# Patient Record
Sex: Male | Born: 1975
Health system: Southern US, Community
[De-identification: ages and names within clinical notes are randomized; demographics above are authoritative.]

## PROBLEM LIST (undated history)

## (undated) DIAGNOSIS — F319 Bipolar disorder, unspecified: Secondary | ICD-10-CM

## (undated) DIAGNOSIS — I219 Acute myocardial infarction, unspecified: Secondary | ICD-10-CM

## (undated) DIAGNOSIS — F1910 Other psychoactive substance abuse, uncomplicated: Secondary | ICD-10-CM

## (undated) DIAGNOSIS — R45851 Suicidal ideations: Secondary | ICD-10-CM

## (undated) DIAGNOSIS — F99 Mental disorder, not otherwise specified: Secondary | ICD-10-CM

## (undated) DIAGNOSIS — K859 Acute pancreatitis without necrosis or infection, unspecified: Secondary | ICD-10-CM

## (undated) DIAGNOSIS — N200 Calculus of kidney: Secondary | ICD-10-CM

## (undated) DIAGNOSIS — F32A Depression, unspecified: Secondary | ICD-10-CM

## (undated) DIAGNOSIS — F419 Anxiety disorder, unspecified: Secondary | ICD-10-CM

## (undated) DIAGNOSIS — F329 Major depressive disorder, single episode, unspecified: Secondary | ICD-10-CM

## (undated) HISTORY — DX: Acute myocardial infarction, unspecified: I21.9

## (undated) HISTORY — PX: ESOPHAGEAL DILATION: SHX303

## (undated) HISTORY — DX: Acute pancreatitis without necrosis or infection, unspecified: K85.90

## (undated) HISTORY — PX: COLONOSCOPY: SHX174

---
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
## 2004-05-12 DIAGNOSIS — I219 Acute myocardial infarction, unspecified: Secondary | ICD-10-CM

## 2004-05-12 HISTORY — DX: Acute myocardial infarction, unspecified: I21.9

## 2005-11-05 HISTORY — PX: CARDIAC CATHETERIZATION: SHX172

## 2016-07-20 ENCOUNTER — Encounter (HOSPITAL_COMMUNITY): Payer: Self-pay | Admitting: Emergency Medicine

## 2016-07-20 ENCOUNTER — Emergency Department (HOSPITAL_COMMUNITY): Payer: Self-pay

## 2016-07-20 ENCOUNTER — Emergency Department (HOSPITAL_COMMUNITY)
Admission: EM | Admit: 2016-07-20 | Discharge: 2016-07-22 | Disposition: A | Discharge status: Other Acute Inpatient Hospital | Payer: Self-pay | Attending: Emergency Medicine | Admitting: Emergency Medicine

## 2016-07-20 DIAGNOSIS — R45851 Suicidal ideations: Secondary | ICD-10-CM

## 2016-07-20 DIAGNOSIS — Z79899 Other long term (current) drug therapy: Secondary | ICD-10-CM | POA: Insufficient documentation

## 2016-07-20 DIAGNOSIS — Z7289 Other problems related to lifestyle: Secondary | ICD-10-CM | POA: Insufficient documentation

## 2016-07-20 DIAGNOSIS — R109 Unspecified abdominal pain: Secondary | ICD-10-CM

## 2016-07-20 DIAGNOSIS — Z765 Malingerer [conscious simulation]: Secondary | ICD-10-CM

## 2016-07-20 DIAGNOSIS — F17210 Nicotine dependence, cigarettes, uncomplicated: Secondary | ICD-10-CM | POA: Insufficient documentation

## 2016-07-20 DIAGNOSIS — R319 Hematuria, unspecified: Secondary | ICD-10-CM

## 2016-07-20 DIAGNOSIS — R10A2 Flank pain, left side: Secondary | ICD-10-CM

## 2016-07-20 DIAGNOSIS — F1910 Other psychoactive substance abuse, uncomplicated: Secondary | ICD-10-CM

## 2016-07-20 HISTORY — DX: Other psychoactive substance abuse, uncomplicated: F19.10

## 2016-07-20 HISTORY — DX: Calculus of kidney: N20.0

## 2016-07-20 HISTORY — DX: Mental disorder, not otherwise specified: F99

## 2016-07-20 LAB — CBC WITH DIFFERENTIAL/PLATELET
Basophils Absolute: 0.1 10*3/uL (ref 0.0–0.1)
Basophils Relative: 1 %
EOS ABS: 0.2 10*3/uL (ref 0.0–0.7)
EOS PCT: 3 %
HCT: 41.5 % (ref 39.0–52.0)
HEMOGLOBIN: 13.9 g/dL (ref 13.0–17.0)
LYMPHS ABS: 2.7 10*3/uL (ref 0.7–4.0)
Lymphocytes Relative: 33 %
MCH: 30 pg (ref 26.0–34.0)
MCHC: 33.5 g/dL (ref 30.0–36.0)
MCV: 89.6 fL (ref 78.0–100.0)
MONOS PCT: 6 %
Monocytes Absolute: 0.5 10*3/uL (ref 0.1–1.0)
Neutro Abs: 4.7 10*3/uL (ref 1.7–7.7)
Neutrophils Relative %: 57 %
Platelets: 219 10*3/uL (ref 150–400)
RBC: 4.63 MIL/uL (ref 4.22–5.81)
RDW: 12.2 % (ref 11.5–15.5)
WBC: 8.1 10*3/uL (ref 4.0–10.5)

## 2016-07-20 LAB — BASIC METABOLIC PANEL
Anion gap: 7 (ref 5–15)
BUN: 11 mg/dL (ref 6–20)
CHLORIDE: 105 mmol/L (ref 101–111)
CO2: 26 mmol/L (ref 22–32)
Calcium: 9.4 mg/dL (ref 8.9–10.3)
Creatinine, Ser: 0.92 mg/dL (ref 0.61–1.24)
Glucose, Bld: 91 mg/dL (ref 65–99)
POTASSIUM: 3.7 mmol/L (ref 3.5–5.1)
Sodium: 138 mmol/L (ref 135–145)

## 2016-07-20 LAB — URINALYSIS, ROUTINE W REFLEX MICROSCOPIC
Bilirubin Urine: NEGATIVE
Glucose, UA: NEGATIVE mg/dL
Ketones, ur: NEGATIVE mg/dL
Leukocytes, UA: NEGATIVE
Nitrite: NEGATIVE
Protein, ur: NEGATIVE mg/dL
SPECIFIC GRAVITY, URINE: 1.017 (ref 1.005–1.030)
pH: 5 (ref 5.0–8.0)

## 2016-07-20 LAB — RAPID URINE DRUG SCREEN, HOSP PERFORMED
AMPHETAMINES: NOT DETECTED
BENZODIAZEPINES: NOT DETECTED
Barbiturates: NOT DETECTED
Cocaine: NOT DETECTED
Opiates: NOT DETECTED
Tetrahydrocannabinol: POSITIVE — AB

## 2016-07-20 LAB — ETHANOL

## 2016-07-20 MED ORDER — CLONIDINE HCL 0.1 MG PO TABS: 0.1000 mg | ORAL_TABLET | Freq: Every day | ORAL | Status: DC

## 2016-07-20 MED ORDER — LOPERAMIDE HCL 2 MG PO CAPS: 2.0000 mg | ORAL_CAPSULE | ORAL | Status: DC | PRN

## 2016-07-20 MED ORDER — DICYCLOMINE HCL 20 MG PO TABS
20.0000 mg | ORAL_TABLET | Freq: Four times a day (QID) | ORAL | Status: DC | PRN
  Filled 2016-07-20: qty 1

## 2016-07-20 MED ORDER — ONDANSETRON 4 MG PO TBDP: 4.0000 mg | ORAL_TABLET | Freq: Four times a day (QID) | ORAL | Status: DC | PRN

## 2016-07-20 MED ORDER — ALUM & MAG HYDROXIDE-SIMETH 200-200-20 MG/5ML PO SUSP: 30.0000 mL | ORAL | Status: DC | PRN

## 2016-07-20 MED ORDER — NAPROXEN 250 MG PO TABS: 500.0000 mg | ORAL_TABLET | Freq: Two times a day (BID) | ORAL | Status: DC | PRN

## 2016-07-20 MED ORDER — HYDROXYZINE HCL 25 MG PO TABS
25.0000 mg | ORAL_TABLET | Freq: Four times a day (QID) | ORAL | Status: DC | PRN
Start: 2016-07-20 — End: 2016-07-22
  Administered 2016-07-22: 25 mg via ORAL
  Filled 2016-07-20: qty 1

## 2016-07-20 MED ORDER — CLONIDINE HCL 0.1 MG PO TABS
0.1000 mg | ORAL_TABLET | Freq: Four times a day (QID) | ORAL | Status: DC
  Administered 2016-07-20 – 2016-07-21 (×3): 0.1 mg via ORAL
  Filled 2016-07-20 (×3): qty 1

## 2016-07-20 MED ORDER — OXYCODONE-ACETAMINOPHEN 5-325 MG PO TABS
1.0000 | ORAL_TABLET | Freq: Once | ORAL | Status: AC
  Administered 2016-07-20: 1 via ORAL
  Filled 2016-07-20: qty 1

## 2016-07-20 MED ORDER — METHOCARBAMOL 500 MG PO TABS: 500.0000 mg | ORAL_TABLET | Freq: Three times a day (TID) | ORAL | Status: DC | PRN

## 2016-07-20 MED ORDER — NICOTINE 21 MG/24HR TD PT24: 21.0000 mg | MEDICATED_PATCH | Freq: Every day | TRANSDERMAL | Status: DC | PRN

## 2016-07-20 MED ORDER — CLONIDINE HCL 0.1 MG PO TABS: 0.1000 mg | ORAL_TABLET | ORAL | Status: DC

## 2016-07-20 MED ORDER — ONDANSETRON 8 MG PO TBDP
8.0000 mg | ORAL_TABLET | Freq: Once | ORAL | Status: AC
  Administered 2016-07-20: 8 mg via ORAL
  Filled 2016-07-20: qty 1

## 2016-07-20 MED ORDER — KETOROLAC TROMETHAMINE 60 MG/2ML IM SOLN
60.0000 mg | Freq: Once | INTRAMUSCULAR | Status: AC
  Administered 2016-07-20: 60 mg via INTRAMUSCULAR
  Filled 2016-07-20: qty 2

## 2016-07-20 NOTE — ED Provider Notes (Signed)
[facility]-EMERGENCY DEPT Provider Note   CSN: [id number] Arrival date & time: 07/20/16  1633     History   Chief Complaint Chief Complaint  Patient presents with  . Flank Pain    HPI Steve Davenport is a 41 y.o. male.   Flank Pain   Pt was seen at 1750. Per pt, c/o sudden onset and persistence of waxing and waning left > right sided flank "pain" that began 2 days ago.  Pt describes the pain as "like my last kidney stone," and radiating into the left > right sides of his abd.  Has been associated with multiple intermittent episodes of N/V, hematuria and several episodes of diarrhea.  Denies testicular pain/swelling, no dysuria, no abd pain, no black or blood in emesis or stools, no CP/SOB, no fevers, no rash.     Past Medical History:  Diagnosis Date  . Kidney stone     There are no active problems to display for this patient.   History reviewed. No pertinent surgical history.     Home Medications    Prior to Admission medications   Not on File    Family History No family history on file.  Social History Social History  Substance Use Topics  . Smoking status: Current Every Day Smoker    Packs/day: 0.50    Types: Cigarettes  . Smokeless tobacco: Never Used  . Alcohol use No     Allergies   Tramadol   Review of Systems Review of Systems  Genitourinary: Positive for flank pain.  ROS: Statement: All systems negative except as marked or noted in the HPI; Constitutional: Negative for fever and chills. ; ; Eyes: Negative for eye pain, redness and discharge. ; ; ENMT: Negative for ear pain, hoarseness, nasal congestion, sinus pressure and sore throat. ; ; Cardiovascular: Negative for chest pain, palpitations, diaphoresis, dyspnea and peripheral edema. ; ; Respiratory: Negative for cough, wheezing and stridor. ; ; Gastrointestinal: +N/V/D. Negative for abdominal pain, blood in stool, hematemesis, jaundice and rectal bleeding. . ; ; Genitourinary: Negative for dysuria.  +flank pain and hematuria. ; ; Genital:  No penile drainage or rash, no testicular pain or swelling, no scrotal rash or swelling. ;; Musculoskeletal: Negative for back pain and neck pain. Negative for swelling and trauma.; ; Skin: Negative for pruritus, rash, abrasions, blisters, bruising and skin lesion.; ; Neuro: Negative for headache, lightheadedness and neck stiffness. Negative for weakness, altered level of consciousness, altered mental status, extremity weakness, paresthesias, involuntary movement, seizure and syncope.      Physical Exam Updated Vital Signs BP 109/65   Pulse 87   Temp 98.5 F (36.9 C)   Resp 18   Ht 5\' 9"  (1.753 m)   Wt 155 lb (70.3 kg)   SpO2 97%   BMI 22.89 kg/m   Physical Exam 1755: Physical examination:  Nursing notes reviewed; Vital signs and O2 SAT reviewed;  Constitutional: Well developed, Well nourished, Well hydrated, In no acute distress; Head:  Normocephalic, atraumatic; Eyes: EOMI, PERRL, No scleral icterus; ENMT: Mouth and pharynx normal, Mucous membranes moist; Neck: Supple, Full range of motion, No lymphadenopathy; Cardiovascular: Regular rate and rhythm, No gallop; Respiratory: Breath sounds clear & equal bilaterally, No wheezes.  Speaking full sentences with ease, Normal respiratory effort/excursion; Chest: Nontender, Movement normal; Abdomen: Soft, Nontender, Nondistended, Normal bowel sounds; Genitourinary: No CVA tenderness; Spine:  No midline CS, TS, LS tenderness.;; Extremities: Pulses normal, No tenderness, No edema, No calf edema or asymmetry.; Neuro: AA&Ox3, Major CN grossly  intact.  Speech clear. No gross focal motor or sensory deficits in extremities. Climbs on and off stretcher easily by himself. Gait steady.; Skin: Color normal, Warm, Dry.    ED Treatments / Results  Labs (all labs ordered are listed, but only abnormal results are displayed)   EKG  EKG Interpretation None       Radiology   Procedures Procedures (including  critical care time)  Medications Ordered in ED Medications  ondansetron (ZOFRAN-ODT) disintegrating tablet 8 mg (not administered)  ketorolac (TORADOL) injection 60 mg (not administered)     Initial Impression / Assessment and Plan / ED Course  I have reviewed the triage vital signs and the nursing notes.  Pertinent labs & imaging results that were available during my care of the patient were reviewed by me and considered in my medical decision making (see chart for details).  MDM Reviewed: nursing note and vitals Interpretation: labs and CT scan    Results for orders placed or performed during the hospital encounter of 07/20/16  Urinalysis, Routine w reflex microscopic- may I&O cath if menses  Result Value Ref Range   Color, Urine YELLOW YELLOW   APPearance HAZY (A) CLEAR   Specific Gravity, Urine 1.017 1.005 - 1.030   pH 5.0 5.0 - 8.0   Glucose, UA NEGATIVE NEGATIVE mg/dL   Hgb urine dipstick LARGE (A) NEGATIVE   Bilirubin Urine NEGATIVE NEGATIVE   Ketones, ur NEGATIVE NEGATIVE mg/dL   Protein, ur NEGATIVE NEGATIVE mg/dL   Nitrite NEGATIVE NEGATIVE   Leukocytes, UA NEGATIVE NEGATIVE   RBC / HPF TOO NUMEROUS TO COUNT 0 - 5 RBC/hpf   WBC, UA 0-5 0 - 5 WBC/hpf   Bacteria, UA RARE (A) NONE SEEN   Mucous PRESENT   Basic metabolic panel  Result Value Ref Range   Sodium 138 135 - 145 mmol/L   Potassium 3.7 3.5 - 5.1 mmol/L   Chloride 105 101 - 111 mmol/L   CO2 26 22 - 32 mmol/L   Glucose, Bld 91 65 - 99 mg/dL   BUN 11 6 - 20 mg/dL   Creatinine, Ser 1.61 0.61 - 1.24 mg/dL   Calcium 9.4 8.9 - 09.6 mg/dL   GFR calc non Af Amer >60 >60 mL/min   GFR calc Af Amer >60 >60 mL/min   Anion gap 7 5 - 15  Ethanol  Result Value Ref Range   Alcohol, Ethyl (B) <5 <5 mg/dL  Urine rapid drug screen (hosp performed)  Result Value Ref Range   Opiates NONE DETECTED NONE DETECTED   Cocaine NONE DETECTED NONE DETECTED   Benzodiazepines NONE DETECTED NONE DETECTED   Amphetamines NONE  DETECTED NONE DETECTED   Tetrahydrocannabinol POSITIVE (A) NONE DETECTED   Barbiturates NONE DETECTED NONE DETECTED  CBC with Differential  Result Value Ref Range   WBC 8.1 4.0 - 10.5 K/uL   RBC 4.63 4.22 - 5.81 MIL/uL   Hemoglobin 13.9 13.0 - 17.0 g/dL   HCT 04.5 40.9 - 81.1 %   MCV 89.6 78.0 - 100.0 fL   MCH 30.0 26.0 - 34.0 pg   MCHC 33.5 30.0 - 36.0 g/dL   RDW 91.4 78.2 - 95.6 %   Platelets 219 150 - 400 K/uL   Neutrophils Relative % 57 %   Neutro Abs 4.7 1.7 - 7.7 K/uL   Lymphocytes Relative 33 %   Lymphs Abs 2.7 0.7 - 4.0 K/uL   Monocytes Relative 6 %   Monocytes Absolute 0.5 0.1 - 1.0 K/uL  Eosinophils Relative 3 %   Eosinophils Absolute 0.2 0.0 - 0.7 K/uL   Basophils Relative 1 %   Basophils Absolute 0.1 0.0 - 0.1 K/uL   Ct Renal Stone Study Result Date: 07/20/2016 CLINICAL DATA:  Nausea and vomiting with diarrhea. EXAM: CT ABDOMEN AND PELVIS WITHOUT CONTRAST TECHNIQUE: Multidetector CT imaging of the abdomen and pelvis was performed following the standard protocol without IV contrast. COMPARISON:  None. FINDINGS: Lower chest:  Unremarkable. Hepatobiliary: No focal abnormality in the liver on this study without intravenous contrast. There is no evidence for gallstones, gallbladder wall thickening, or pericholecystic fluid. No intrahepatic or extrahepatic biliary dilation. Pancreas: No focal mass lesion. No dilatation of the main duct. No intraparenchymal cyst. No peripancreatic edema. Spleen: No splenomegaly. No focal mass lesion. Adrenals/Urinary Tract: No adrenal nodule or mass. Kidneys are unremarkable. No evidence for hydroureter. The urinary bladder appears normal for the degree of distention. Stomach/Bowel: Stomach is nondistended. No gastric wall thickening. No evidence of outlet obstruction. Duodenum is normally positioned as is the ligament of Treitz. No small bowel wall thickening. No small bowel dilatation. The terminal ileum is normal. Appendicolith noted with otherwise  normal appendix. Some fluid is noted in the right colon, but left colon is unremarkable. Vascular/Lymphatic: No abdominal aortic aneurysm. No abdominal aortic atherosclerotic calcification. There is no gastrohepatic or hepatoduodenal ligament lymphadenopathy. No intraperitoneal or retroperitoneal lymphadenopathy. No pelvic sidewall lymphadenopathy. Reproductive: The prostate gland and seminal vesicles have normal imaging features. Other: No intraperitoneal free fluid. Musculoskeletal: Bone windows reveal no worrisome lytic or sclerotic osseous lesions. IMPRESSION: 1. No acute findings in the abdomen or pelvis. No evidence to explain the patient's history of nausea and vomiting. Electronically Signed   By: Kennith CenterEric  Mansell M.D.   On: 07/20/2016 20:37    2000:  Pt now stats he endorsing SI. States he is also "going through withdrawals." Also states he has been buying his cousin's suboxone and not taking his psych meds (seroquel and xanax).  Security called to wand. Sitter at bedside.   2145:  Non-specific hematuria; f/u Uro MD. Workup otherwise reassuring. TTS evaluation pending.  2215:  TTS has evaluated pt: inpt treatment recommended, placement pending. Holding orders written, including clonidine detox protocol.    Final Clinical Impressions(s) / ED Diagnoses   Final diagnoses:  None    New Prescriptions New Prescriptions   No medications on file      Samuel JesterKathleen Ova Meegan, DO 07/20/16 2219

## 2016-07-20 NOTE — ED Notes (Signed)
To CT
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
## 2016-07-20 NOTE — ED Notes (Signed)
Pt reports a 3 days history of flank pain and urinating blood- specimen obtained tat was cloudy but not overtly pink or red meds as ordered

## 2016-07-20 NOTE — ED Notes (Signed)
Awaiting TTS assessment.  

## 2016-07-20 NOTE — ED Notes (Signed)
Pt reports that his pain is better, but that he has been buying his cousin's suboxone and has not been taking his seroquel and xanax due to being otu of meds- he vacillates regarding his fear of whether or not he is self injurious   Pt told that Dr Mike GipMcM will come to speak with  Him - Dr Judie PetitM is apprised

## 2016-07-20 NOTE — ED Triage Notes (Signed)
Patient brought in via EMS. Alert and oriented. Airway patent. Patient c/o bilateral flank pain that radiates into lower abd. Patient does report nausea and vomiting with small amount of diarrhea this morning. Denies any fevers. Patient reports dysuria and hematuria. Per patient has had kidney stones in past.

## 2016-07-20 NOTE — ED Notes (Signed)
Pt has told several different accounts of his malady 1. He has flank opain and is having bloody urine  2. That he is trying to gain disability due to an injury of his R hand that renders him unable to use him hand (he uses it to undress, and uses it ad lib)  3. That he is suicidal having not had his Seroquel and xanax and has no money to go to county mental health services and is fearful of going into withdrawal- Also reports that he has been buying cousin's meds to keep from going into withdrawal  4. That he needs to speak with a psych doctor- as he feels that he will hurt himself-  Press photographerCharge nurse and physician notified scrubs, wanded, and sitter assigned

## 2016-07-20 NOTE — ED Notes (Signed)
[facility] from [facility]- pt is recommended for admission- [facility] and pt will be shopped

## 2016-07-21 LAB — URINE CULTURE: Culture: <10000 — AB

## 2016-07-21 LAB — CBG MONITORING, ED: Glucose-Capillary: 124 mg/dL — ABNORMAL HIGH (ref 65–99)

## 2016-07-21 MED ORDER — DICYCLOMINE HCL 10 MG PO CAPS: 20.0000 mg | ORAL_CAPSULE | Freq: Four times a day (QID) | ORAL | Status: DC | PRN

## 2016-07-21 NOTE — Progress Notes (Signed)
Pt iinitially admitted to [facility] ED with complaints of flank pain and urinary bleeding, is abusing multiple drugs. Reports SI and hallucinations.  CSW referred to the following: [facility], [place], [address], [address] [place], [address] [place], [address] [facility], [place], [address], [address], [place], [place], [facility], [place], [place]    Kamee Bobst T. Kaylyn LimSutter, MSW, LCSWA Clinical Social Work Disposition [phone]

## 2016-07-21 NOTE — ED Notes (Signed)
Pt given drink 

## 2016-07-22 ENCOUNTER — Inpatient Hospital Stay
Admission: EM | Admit: 2016-07-22 | Discharge: 2016-07-28 | DRG: 885 | Disposition: A | Discharge status: Home / Self Care | Payer: No Typology Code available for payment source | Source: Intra-hospital | Attending: Psychiatry | Admitting: Psychiatry

## 2016-07-22 DIAGNOSIS — F17210 Nicotine dependence, cigarettes, uncomplicated: Secondary | ICD-10-CM | POA: Diagnosis present

## 2016-07-22 DIAGNOSIS — I2510 Atherosclerotic heart disease of native coronary artery without angina pectoris: Secondary | ICD-10-CM | POA: Diagnosis present

## 2016-07-22 DIAGNOSIS — F4310 Post-traumatic stress disorder, unspecified: Secondary | ICD-10-CM | POA: Diagnosis present

## 2016-07-22 DIAGNOSIS — Z590 Homelessness: Secondary | ICD-10-CM | POA: Diagnosis not present

## 2016-07-22 DIAGNOSIS — F319 Bipolar disorder, unspecified: Secondary | ICD-10-CM | POA: Diagnosis present

## 2016-07-22 DIAGNOSIS — F1123 Opioid dependence with withdrawal: Secondary | ICD-10-CM | POA: Diagnosis present

## 2016-07-22 DIAGNOSIS — F1320 Sedative, hypnotic or anxiolytic dependence, uncomplicated: Secondary | ICD-10-CM | POA: Diagnosis present

## 2016-07-22 DIAGNOSIS — F1120 Opioid dependence, uncomplicated: Secondary | ICD-10-CM

## 2016-07-22 DIAGNOSIS — G4700 Insomnia, unspecified: Secondary | ICD-10-CM | POA: Diagnosis present

## 2016-07-22 DIAGNOSIS — F17200 Nicotine dependence, unspecified, uncomplicated: Secondary | ICD-10-CM

## 2016-07-22 DIAGNOSIS — R45851 Suicidal ideations: Secondary | ICD-10-CM | POA: Diagnosis present

## 2016-07-22 DIAGNOSIS — Z818 Family history of other mental and behavioral disorders: Secondary | ICD-10-CM

## 2016-07-22 DIAGNOSIS — F315 Bipolar disorder, current episode depressed, severe, with psychotic features: Secondary | ICD-10-CM | POA: Diagnosis present

## 2016-07-22 DIAGNOSIS — J069 Acute upper respiratory infection, unspecified: Secondary | ICD-10-CM | POA: Diagnosis present

## 2016-07-22 DIAGNOSIS — F1193 Opioid use, unspecified with withdrawal: Secondary | ICD-10-CM

## 2016-07-22 DIAGNOSIS — Z87442 Personal history of urinary calculi: Secondary | ICD-10-CM

## 2016-07-22 HISTORY — DX: Bipolar disorder, unspecified: F31.9

## 2016-07-22 HISTORY — DX: Depression, unspecified: F32.A

## 2016-07-22 HISTORY — DX: Major depressive disorder, single episode, unspecified: F32.9

## 2016-07-22 HISTORY — DX: Anxiety disorder, unspecified: F41.9

## 2016-07-22 MED ORDER — NICOTINE 21 MG/24HR TD PT24
21.0000 mg | MEDICATED_PATCH | Freq: Every day | TRANSDERMAL | Status: DC
  Administered 2016-07-23 – 2016-07-27 (×5): 21 mg via TRANSDERMAL
  Filled 2016-07-22 (×5): qty 1

## 2016-07-22 MED ORDER — ACETAMINOPHEN 500 MG PO TABS: 1000.0000 mg | ORAL_TABLET | Freq: Four times a day (QID) | ORAL | Status: DC | PRN

## 2016-07-22 MED ORDER — MAGNESIUM HYDROXIDE 400 MG/5ML PO SUSP: 30.0000 mL | Freq: Every day | ORAL | Status: DC | PRN

## 2016-07-22 MED ORDER — HYDROXYZINE HCL 25 MG PO TABS
50.0000 mg | ORAL_TABLET | Freq: Three times a day (TID) | ORAL | Status: DC | PRN
  Administered 2016-07-22 – 2016-07-27 (×3): 50 mg via ORAL
  Filled 2016-07-22: qty 2
  Filled 2016-07-22 (×2): qty 1
  Filled 2016-07-22: qty 2
  Filled 2016-07-22: qty 1

## 2016-07-22 MED ORDER — CYCLOBENZAPRINE HCL 10 MG PO TABS: 5.0000 mg | ORAL_TABLET | Freq: Three times a day (TID) | ORAL | Status: DC | PRN

## 2016-07-22 MED ORDER — LOPERAMIDE HCL 2 MG PO CAPS: 4.0000 mg | ORAL_CAPSULE | ORAL | Status: DC | PRN

## 2016-07-22 MED ORDER — TRAZODONE HCL 100 MG PO TABS
150.0000 mg | ORAL_TABLET | Freq: Every evening | ORAL | Status: DC | PRN
  Administered 2016-07-22 – 2016-07-26 (×3): 150 mg via ORAL
  Filled 2016-07-22 (×3): qty 1

## 2016-07-22 MED ORDER — IBUPROFEN 600 MG PO TABS
600.0000 mg | ORAL_TABLET | Freq: Four times a day (QID) | ORAL | Status: DC | PRN
  Administered 2016-07-22: 600 mg via ORAL
  Filled 2016-07-22: qty 1

## 2016-07-22 MED ORDER — ALUM & MAG HYDROXIDE-SIMETH 200-200-20 MG/5ML PO SUSP
30.0000 mL | ORAL | Status: DC | PRN
  Administered 2016-07-25 – 2016-07-26 (×4): 30 mL via ORAL
  Filled 2016-07-22 (×5): qty 30

## 2016-07-22 MED ORDER — ACETAMINOPHEN 325 MG PO TABS: 650.0000 mg | ORAL_TABLET | Freq: Four times a day (QID) | ORAL | Status: DC | PRN

## 2016-07-22 MED ORDER — ONDANSETRON 8 MG PO TBDP
8.0000 mg | ORAL_TABLET | Freq: Three times a day (TID) | ORAL | Status: DC | PRN
  Administered 2016-07-26: 8 mg via ORAL
  Filled 2016-07-22 (×2): qty 1

## 2016-07-22 NOTE — BH Assessment (Signed)
Patient has been accepted to [facility].  Accepting physician is Dr. Alyse LowA Kumar.  Attending Physician will be Dr. Jennet MaduroPucilowska.  Patient has been assigned to room 324, by [facility] Charge Nurse Edwena BundeJanet J.  Call report to [phone].  Representative/Transfer Coordinator is Warden/rangerCalvin Patient pre-admitted by [facility] Patient Access Rivka Barbara(Glenda)  [facility] Staff Lurena Joiner(Rebecca, RN) made aware of acceptance.
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
## 2016-07-22 NOTE — ED Notes (Signed)
Meal given to pt.

## 2016-07-22 NOTE — ED Notes (Signed)
[facility] called and pt has been accepted at [facility] to room 324.

## 2016-07-22 NOTE — ED Notes (Addendum)
Pt leaving with pelham at this time, belongings given to pelham and associated paperwork: EMTALA, medical necessity form, facesheet, e-signature, carelink/transfer. Pt alert at discharge.

## 2016-07-22 NOTE — ED Provider Notes (Signed)
Accepted to [facility] by Dr. Lucianne MussKumar. Records reviewed. Patient vitals stable. Blood work reassuring. Medically cleared for transfer for ongoing psychiatric treatment.   Lavera Guiseana Duo Zaide Mcclenahan, MD 07/22/16 1335

## 2016-07-22 NOTE — ED Notes (Signed)
Pt given meal tray.

## 2016-07-22 NOTE — Tx Team (Signed)
Initial Treatment Plan 07/22/2016 5:41 PM Steve Davenport [id number]    PATIENT STRESSORS: Financial difficulties Occupational concerns Other: AVH   PATIENT STRENGTHS: Ability for insight Communication skills   PATIENT IDENTIFIED PROBLEMS:   Hearing voices  Thinking demons are after him  Recent loss of home  Recent loss of father             DISCHARGE CRITERIA:  Ability to meet basic life and health needs Improved stabilization in mood, thinking, and/or behavior Medical problems require only outpatient monitoring Motivation to continue treatment in a less acute level of care Safe-care adequate arrangements made Verbal commitment to aftercare and medication compliance  PRELIMINARY DISCHARGE PLAN: Attend aftercare/continuing care group Outpatient therapy Placement in alternative living arrangements  PATIENT/FAMILY INVOLVEMENT: This treatment plan has been presented to and reviewed with the patient, Steve Davenport.  The patient and family have been given the opportunity to ask questions and make suggestions.  Johann CapersMegan  Savino Whisenant, RN 07/22/2016, 5:41 PM

## 2016-07-23 ENCOUNTER — Encounter: Payer: Self-pay | Admitting: Psychiatry

## 2016-07-23 DIAGNOSIS — F315 Bipolar disorder, current episode depressed, severe, with psychotic features: Secondary | ICD-10-CM

## 2016-07-23 MED ORDER — QUETIAPINE FUMARATE 25 MG PO TABS
25.0000 mg | ORAL_TABLET | Freq: Three times a day (TID) | ORAL | Status: DC
  Administered 2016-07-23 – 2016-07-27 (×13): 25 mg via ORAL
  Filled 2016-07-23 (×13): qty 1

## 2016-07-23 MED ORDER — PRAZOSIN HCL 2 MG PO CAPS: 2.0000 mg | ORAL_CAPSULE | Freq: Two times a day (BID) | ORAL | Status: DC

## 2016-07-23 MED ORDER — PRAZOSIN HCL 1 MG PO CAPS
1.0000 mg | ORAL_CAPSULE | Freq: Two times a day (BID) | ORAL | Status: DC
  Administered 2016-07-23 – 2016-07-24 (×2): 1 mg via ORAL
  Filled 2016-07-23 (×2): qty 1

## 2016-07-23 MED ORDER — QUETIAPINE FUMARATE 100 MG PO TABS
100.0000 mg | ORAL_TABLET | Freq: Every day | ORAL | Status: DC
  Administered 2016-07-23: 100 mg via ORAL
  Filled 2016-07-23: qty 1

## 2016-07-23 MED ORDER — FLUVOXAMINE MALEATE 50 MG PO TABS
50.0000 mg | ORAL_TABLET | Freq: Every day | ORAL | Status: DC
  Administered 2016-07-23: 50 mg via ORAL
  Filled 2016-07-23: qty 1

## 2016-07-23 MED ORDER — RISPERIDONE 1 MG PO TABS: 2.0000 mg | ORAL_TABLET | Freq: Two times a day (BID) | ORAL | Status: DC

## 2016-07-23 MED ORDER — AZITHROMYCIN 500 MG PO TABS
500.0000 mg | ORAL_TABLET | Freq: Every day | ORAL | Status: AC
Start: 2016-07-23 — End: 2016-07-23
  Administered 2016-07-23: 500 mg via ORAL
  Filled 2016-07-23: qty 1

## 2016-07-23 MED ORDER — BENZONATATE 100 MG PO CAPS
200.0000 mg | ORAL_CAPSULE | Freq: Three times a day (TID) | ORAL | Status: DC
  Administered 2016-07-23 – 2016-07-27 (×13): 200 mg via ORAL
  Filled 2016-07-23 (×13): qty 2

## 2016-07-23 MED ORDER — AZITHROMYCIN 250 MG PO TABS
250.0000 mg | ORAL_TABLET | Freq: Every day | ORAL | Status: AC
  Administered 2016-07-24 – 2016-07-27 (×4): 250 mg via ORAL
  Filled 2016-07-23 (×4): qty 1

## 2016-07-23 NOTE — Progress Notes (Signed)
Pt anxious. Reports not doing well at all, but mainly concerned of cough and upper respiratory symptoms. Md notified new orders received. Pt visible in milieu today socializing with peers. Interacts appropriately with staff. Denies SI, HI, endorses AVH. Pt thinks snakes are in his belly. Encouragement and support offered. Safety checks maintained. Pt receptive and remains safe on unit with q 15 min checks.

## 2016-07-23 NOTE — [facility] Suicide Risk Assessment (Signed)
[facility] Admission Suicide Risk Assessment   Nursing information obtained from:    Demographic factors:    Current Mental Status:    Loss Factors:    Historical Factors:    Risk Reduction Factors:     Total Time spent with patient: 1 hour Principal Problem: Bipolar I disorder, current or most recent episode depressed, with psychotic features [facility] Diagnosis:   Patient Active Problem List   Diagnosis Date Noted  . Bipolar I disorder, current or most recent episode depressed, with psychotic features (HCC) [F31.5] 07/22/2016  . Opioid withdrawal (HCC) [F11.23] 07/22/2016  . Opioid use disorder, severe, dependence (HCC) [F11.20] 07/22/2016  . Tobacco use disorder [F17.200] 07/22/2016  . Sedative, hypnotic or anxiolytic use disorder, severe, dependence (HCC) [F13.20] 07/22/2016   Subjective Data: depression, suicidal ideation.  Continued Clinical Symptoms:  Alcohol Use Disorder Identification Test Final Score (AUDIT): 0 The "Alcohol Use Disorders Identification Test", Guidelines for Use in Primary Care, Second Edition.  World Science writer [facility]). Score between 0-7:  no or low risk or alcohol related problems. Score between 8-15:  moderate risk of alcohol related problems. Score between 16-19:  high risk of alcohol related problems. Score 20 or above:  warrants further diagnostic evaluation for alcohol dependence and treatment.   CLINICAL FACTORS:   Severe Anxiety and/or Agitation Panic Attacks Bipolar Disorder:   Mixed State Depression:   Comorbid alcohol abuse/dependence Delusional Impulsivity Insomnia Alcohol/Substance Abuse/Dependencies Obsessive-Compulsive Disorder Currently Psychotic Unstable or Poor Therapeutic Relationship Previous Psychiatric Diagnoses and Treatments Medical Diagnoses and Treatments/Surgeries   Musculoskeletal: Strength & Muscle Tone: within normal limits Gait & Station: normal Patient leans: N/A  Psychiatric Specialty Exam: Physical Exam   Nursing note and vitals reviewed. Psychiatric: His mood appears anxious. His speech is rapid and/or pressured. He is hyperactive and actively hallucinating. Thought content is paranoid and delusional. Cognition and memory are normal. He expresses impulsivity. He exhibits a depressed mood. He expresses suicidal ideation. He expresses suicidal plans.    Review of Systems  Psychiatric/Behavioral: Positive for depression, hallucinations, substance abuse and suicidal ideas. The patient is nervous/anxious.   All other systems reviewed and are negative.   Blood pressure 106/67, pulse 96, temperature 98.1 F (36.7 C), temperature source Oral, resp. rate 20, height 5\' 9"  (1.753 m), weight 69.4 kg (153 lb), SpO2 99 %.Body mass index is 22.59 kg/m.  General Appearance: Casual  Eye Contact:  Good  Speech:  Pressured  Volume:  Normal  Mood:  Anxious, Depressed and Hopeless  Affect:  Congruent  Thought Process:  Goal Directed and Descriptions of Associations: Intact  Orientation:  Full (Time, Place, and Person)  Thought Content:  Delusions, Hallucinations: Auditory and Paranoid Ideation  Suicidal Thoughts:  Yes.  with intent/plan  Homicidal Thoughts:  No  Memory:  Immediate;   Fair Recent;   Fair Remote;   Fair  Judgement:  Poor  Insight:  Lacking  Psychomotor Activity:  Increased  Concentration:  Concentration: Fair and Attention Span: Fair  Recall:  [employer] of Knowledge:  Fair  Language:  Fair  Akathisia:  No  Handed:  Right  AIMS (if indicated):     Assets:  Communication Skills Desire for Improvement Resilience Social Support  ADL's:  Intact  Cognition:  WNL  Sleep:  Number of Hours: 7.15      COGNITIVE FEATURES THAT CONTRIBUTE TO RISK:  None    SUICIDE RISK:   Moderate:  Frequent suicidal ideation with limited intensity, and duration, some specificity in terms of  plans, no associated intent, good self-control, limited dysphoria/symptomatology, some risk factors present,  and identifiable protective factors, including available and accessible social support.  PLAN OF CARE: Hospital admission: Medication management, opioid detox, substance abuse counseling, discharge planning.  [name] is a 41 year old male with a history of depression, anxiety, mood instability, and substance abuse admitted for worsening of depression and suicidal ideation in the context of major loss, severe social stressors, and substance abuse.  1. Suicidal ideation. The patient is able to contract for safety in the hospital.  2. Mood and psychosis. We will start Luvox for depression and anxiety and Risperdal for psychosis.  3. Opiate dependence. The patient claims to be withdrawing from Suboxone. We will offer symptomatic treatment.  4. Smoking. Nicotine patch is available.  5. Insomnia. Trazodone is available.  6. PTSD. We will start Minipress 1 mg twice daily. His blood pressure is quite low.  7. Substance abuse treatment. The patient desires residential treatment. We will contact [facility].  8. Disposition. To be established.  I certify that inpatient services furnished can reasonably be expected to improve the patient's condition.   Kristine LineaJolanta Andrey Mccaskill, MD 07/23/2016, 12:32 PM

## 2016-07-23 NOTE — H&P (Signed)
Psychiatric Admission Assessment Adult  Patient Identification: Steve Davenport MRN:  [id number] Date of Evaluation:  07/23/2016 Chief Complaint:  Depression Principal Diagnosis: Bipolar I disorder, current or most recent episode depressed, with psychotic features [facility](HCC) Diagnosis:   Patient Active Problem List   Diagnosis Date Noted  . Bipolar I disorder, current or most recent episode depressed, with psychotic features (HCC) [F31.5] 07/22/2016  . Opioid withdrawal (HCC) [F11.23] 07/22/2016  . Opioid use disorder, severe, dependence (HCC) [F11.20] 07/22/2016  . Tobacco use disorder [F17.200] 07/22/2016  . Sedative, hypnotic or anxiolytic use disorder, severe, dependence (HCC) [F13.20] 07/22/2016   History of Present Illness:   Identifying data. Steve Davenport is a 41 year old male with a history of depression, mood instability, and substance abuse.  Chief complaint. "I have PTSD, ADHD, and bipolar. I have many medical problems."  History of present illness. Information was obtained from the patient the patient lives at [facility] room initially complaining of severe abdominal pain that he felt could have been associated with kidney stones as he had bloody urine. CT scan of abdomen ruled out any abnormalities. The patient then disclosed that he has a history of bipolar disorder and has been severely depressed, anxious, and suicidal for the past year. He complains of poor sleep, decreased appetite with 15 pound weight loss, anhedonia, feeling of guilt hopelessness and worthlessness, poor energy and concentration, social isolation, and crying spells. He started thinking of suicide by injecting air or delaying onto his veins. His anxiety has been out of control and he reports constant panic attacks, social anxiety, PTSD with nightmares and flashbacks, and OCD with constant cleaning and organizing. He also started experiencing auditory hallucinations and belief that he has snakes in his stomach  that he wants to cut out. The patient reports long history of substance abuse but most recently his been abusing cocaine, opioids including Suboxone and benzodiazepines.  Past psychiatric history. The patient reports multiple psychiatric admissions in childhood and adolescence mostly to [facility]. He had several psychiatric admissions in adulthood including for suicide attempts with hanging. He has been tried on multiple medications but only remembers being on lithium and Depakote. Most recently while living in [place] he was a patient of Dr. Janeece RiggersSu at [facility] were he was treated with Seroquel for mood stabilization and Minipress for PTSD symptoms. [place], he lost access to mental health services or medications.   Family psychiatric history. Father and aunt with bipolar. Two cousins ages 6913 and 4517 committed suicide.  Social history. He is originally from [place] but has been living in [place] for years. He lost housing when his father died recently. Applied for disability 4 months ago for physical (HepC, coronary artery disease, hand injury) as well as mental illness. He is homeless in [place] and uninsured.  Total Time spent with patient: 1 hour  Is the patient at risk to self? Yes.    Has the patient been a risk to self in the past 6 months? No.  Has the patient been a risk to self within the distant past? Yes.    Is the patient a risk to others? No.  Has the patient been a risk to others in the past 6 months? No.  Has the patient been a risk to others within the distant past? No.   Prior Inpatient Therapy:   Prior Outpatient Therapy:    Alcohol Screening: 1. How often do you have a drink containing alcohol?: Never 9. Have  you or someone else been injured as a result of your drinking?: No 10. Has a relative or friend or a doctor or another health worker been concerned about your drinking or suggested you cut down?: No Alcohol Use Disorder  Identification Test Final Score (AUDIT): 0 Brief Intervention: AUDIT score less than 7 or less-screening does not suggest unhealthy drinking-brief intervention not indicated Substance Abuse History in the last 12 months:  Yes.   Consequences of Substance Abuse: Negative Previous Psychotropic Medications: Yes Psychological Evaluations: No  Past Medical History:  Past Medical History:  Diagnosis Date  . Anxiety   . Bipolar disorder (HCC)   . Depression   . Kidney stone   . Mental health disorder   . Polysubstance abuse    History reviewed. No pertinent surgical history. Family History: History reviewed. No pertinent family history.  Tobacco Screening: Have you used any form of tobacco in the last 30 days? (Cigarettes, Smokeless Tobacco, Cigars, and/or Pipes): Yes Tobacco use, Select all that apply: 5 or more cigarettes per day Are you interested in Tobacco Cessation Medications?: Yes, will notify MD for an order Counseled patient on smoking cessation including recognizing danger situations, developing coping skills and basic information about quitting provided: Yes Social History:  History  Alcohol Use No     History  Drug Use No    Additional Social History: Marital status: Single Are you sexually active?: No What is your sexual orientation?: heterosexual Does patient have children?: Yes How many children?: 2 How is patient's relationship with their children?: 33 daughter, 10 son.  BOth live in [place].  No recent contact.    History of alcohol / drug use?: Yes Negative Consequences of Use: Financial, Personal relationships, Work / School                    Allergies:   Allergies  Allergen Reactions  . Tilapia [Fish Allergy] Shortness Of Breath    Swelling of throat  . Tramadol    Lab Results:  Results for orders placed or performed during the hospital encounter of 07/20/16 (from the past 48 hour(s))  CBG monitoring, ED     Status: Abnormal   Collection  Time: 07/21/16  7:23 PM  Result Value Ref Range   Glucose-Capillary 124 (H) 65 - 99 mg/dL    Blood Alcohol level:  Lab Results  Component Value Date   ETH <5 07/20/2016    Metabolic Disorder Labs:  No results found for: HGBA1C, MPG No results found for: PROLACTIN No results found for: CHOL, TRIG, HDL, CHOLHDL, VLDL, LDLCALC  Current Medications: Current Facility-Administered Medications  Medication Dose Route Frequency Provider Last Rate Last Dose  . acetaminophen (TYLENOL) tablet 1,000 mg  1,000 mg Oral Q6H PRN Jimmy Footman, MD      . alum & mag hydroxide-simeth (MAALOX/MYLANTA) 200-200-20 MG/5ML suspension 30 mL  30 mL Oral Q4H PRN Jimmy Footman, MD      . cyclobenzaprine (FLEXERIL) tablet 5 mg  5 mg Oral TID PRN Jimmy Footman, MD      . fluvoxaMINE (LUVOX) tablet 50 mg  50 mg Oral QHS Jerra Huckeby B Ishana Blades, MD      . hydrOXYzine (ATARAX/VISTARIL) tablet 50 mg  50 mg Oral TID PRN Jimmy Footman, MD   50 mg at 07/22/16 2159  . ibuprofen (ADVIL,MOTRIN) tablet 600 mg  600 mg Oral Q6H PRN Jimmy Footman, MD   600 mg at 07/22/16 2000  . loperamide (IMODIUM) capsule 4 mg  4 mg  Oral PRN Jimmy Footman, MD      . magnesium hydroxide (MILK OF MAGNESIA) suspension 30 mL  30 mL Oral Daily PRN Jimmy Footman, MD      . nicotine (NICODERM CQ - dosed in mg/24 hours) patch 21 mg  21 mg Transdermal Daily Jimmy Footman, MD   21 mg at 07/23/16 0803  . ondansetron (ZOFRAN-ODT) disintegrating tablet 8 mg  8 mg Oral Q8H PRN Jimmy Footman, MD      . prazosin (MINIPRESS) capsule 1 mg  1 mg Oral BID Brileigh Sevcik B Dierks Wach, MD      . risperiDONE (RISPERDAL) tablet 2 mg  2 mg Oral BID Ferrin Liebig B Browning Southwood, MD      . traZODone (DESYREL) tablet 150 mg  150 mg Oral QHS PRN Jimmy Footman, MD   150 mg at 07/22/16 2201   PTA Medications: Prescriptions Prior to Admission  Medication Sig Dispense  Refill Last Dose  . ranitidine (ZANTAC) 150 MG tablet Take 150 mg by mouth 2 (two) times daily.       Musculoskeletal: Strength & Muscle Tone: within normal limits Gait & Station: normal Patient leans: N/A  Psychiatric Specialty Exam: Physical Exam  Nursing note and vitals reviewed. Constitutional: He is oriented to person, place, and time. He appears well-developed and well-nourished.  HENT:  Head: Normocephalic and atraumatic.  Eyes: Conjunctivae and EOM are normal. Pupils are equal, round, and reactive to light.  Neck: Normal range of motion. Neck supple.  Cardiovascular: Normal rate, regular rhythm and normal heart sounds.   Respiratory: Effort normal and breath sounds normal.  GI: Soft. Bowel sounds are normal.  Musculoskeletal: Normal range of motion.  Neurological: He is alert and oriented to person, place, and time.  Skin: Skin is warm and dry.  Psychiatric: His mood appears anxious. His speech is rapid and/or pressured. He is actively hallucinating. Thought content is paranoid and delusional. Cognition and memory are normal. He expresses impulsivity. He exhibits a depressed mood. He expresses suicidal ideation. He expresses suicidal plans.    Review of Systems  Musculoskeletal: Positive for joint pain.  Neurological: Positive for sensory change.  Psychiatric/Behavioral: Positive for depression, hallucinations, substance abuse and suicidal ideas. The patient is nervous/anxious and has insomnia.   All other systems reviewed and are negative.   Blood pressure 106/67, pulse 96, temperature 98.1 F (36.7 C), temperature source Oral, resp. rate 20, height 5\' 9"  (1.753 m), weight 69.4 kg (153 lb), SpO2 99 %.Body mass index is 22.59 kg/m.  See SRA.                                                  Sleep:  Number of Hours: 7.15    Treatment Plan Summary: Daily contact with patient to assess and evaluate symptoms and progress in treatment and Medication  management   Mr. Jankowski is a 41 year old male with a history of depression, anxiety, mood instability, and substance abuse admitted for worsening of depression and suicidal ideation in the context of major loss, severe social stressors, and substance abuse.  1. Suicidal ideation. The patient is able to contract for safety in the hospital.  2. Mood and psychosis. We will start Luvox for depression and anxiety and Risperdal for psychosis.  3. Opiate dependence. The patient claims to be withdrawing from Suboxone. We will offer symptomatic treatment.  4. Smoking.  Nicotine patch is available.  5. Insomnia. Trazodone is available.  6. PTSD. We will start Minipress 1 mg twice daily. His blood pressure is quite low.  7. Substance abuse treatment. The patient desires residential treatment. We will contact [facility].  8. Disposition. To be established.   Observation Level/Precautions:  15 minute checks  Laboratory:  CBC Chemistry Profile UDS UA  Psychotherapy:    Medications:    Consultations:    Discharge Concerns:    Estimated LOS:  Other:     Physician Treatment Plan for Primary Diagnosis: Bipolar I disorder, current or most recent episode depressed, with psychotic features (HCC) Long Term Goal(s): Improvement in symptoms so as ready for discharge  Short Term Goals: Ability to identify changes in lifestyle to reduce recurrence of condition will improve, Ability to verbalize feelings will improve, Ability to disclose and discuss suicidal ideas, Ability to demonstrate self-control will improve, Ability to identify and develop effective coping behaviors will improve, Ability to maintain clinical measurements within normal limits will improve, Compliance with prescribed medications will improve and Ability to identify triggers associated with substance abuse/mental health issues will improve  Physician Treatment Plan for Secondary Diagnosis: Principal Problem:   Bipolar I disorder, current  or most recent episode depressed, with psychotic features (HCC) Active Problems:   Opioid withdrawal (HCC)   Opioid use disorder, severe, dependence (HCC)   Tobacco use disorder   Sedative, hypnotic or anxiolytic use disorder, severe, dependence (HCC)  Long Term Goal(s): Improvement in symptoms so as ready for discharge  Short Term Goals: Ability to identify changes in lifestyle to reduce recurrence of condition will improve, Ability to demonstrate self-control will improve and Ability to identify triggers associated with substance abuse/mental health issues will improve  I certify that inpatient services furnished can reasonably be expected to improve the patient's condition.    Kristine Linea, MD 3/14/201812:44 PM

## 2016-07-23 NOTE — Progress Notes (Signed)
Recreation Therapy Notes  Date: 03.14.18 Time: 9:30 am Location: Craft Room  Group Topic: Self-esteem  Goal Area(s) Addresses:  Patient will write at least one positive trait about self. Patient will verbalize benefit of having healthy self-esteem.  Behavioral Response: Did not attend  Intervention: I Am  Activity: Patients were given a worksheet with the letter I on it and were instructed to write as many positive traits about themselves inside the letter.  Education: LRT educated patients on ways they can increase their self-esteem.  Education Outcome: Patient did not attend group.  Clinical Observations/Feedback: Patient did not attend group.  Jacquelynn CreeGreene,Atlas Kuc M, LRT/CTRS 07/23/2016 10:10 AM

## 2016-07-23 NOTE — Tx Team (Addendum)
Interdisciplinary Treatment and Diagnostic Plan Update  07/23/2016 Time of Session: 10:30 AM Steve SalinaMichael Davenport MRN: [id number]  Principal Diagnosis: Depressive disorder  Secondary Diagnoses: Principal Problem:   Unspecified depressive disorder Active Problems:   Opioid withdrawal (HCC)   Opioid use disorder, severe, dependence (HCC)   Tobacco use disorder   Cocaine use disorder, severe, dependence (HCC)   Alcohol use disorder, moderate, dependence (HCC)   Sedative, hypnotic or anxiolytic use disorder, severe, dependence (HCC)   Current Medications:  Current Facility-Administered Medications  Medication Dose Route Frequency Provider Last Rate Last Dose  . acetaminophen (TYLENOL) tablet 1,000 mg  1,000 mg Oral Q6H PRN Jimmy FootmanAndrea Hernandez-Gonzalez, MD      . alum & mag hydroxide-simeth (MAALOX/MYLANTA) 200-200-20 MG/5ML suspension 30 mL  30 mL Oral Q4H PRN Jimmy FootmanAndrea Hernandez-Gonzalez, MD      . cyclobenzaprine (FLEXERIL) tablet 5 mg  5 mg Oral TID PRN Jimmy FootmanAndrea Hernandez-Gonzalez, MD      . hydrOXYzine (ATARAX/VISTARIL) tablet 50 mg  50 mg Oral TID PRN Jimmy FootmanAndrea Hernandez-Gonzalez, MD   50 mg at 07/22/16 2159  . ibuprofen (ADVIL,MOTRIN) tablet 600 mg  600 mg Oral Q6H PRN Jimmy FootmanAndrea Hernandez-Gonzalez, MD   600 mg at 07/22/16 2000  . loperamide (IMODIUM) capsule 4 mg  4 mg Oral PRN Jimmy FootmanAndrea Hernandez-Gonzalez, MD      . magnesium hydroxide (MILK OF MAGNESIA) suspension 30 mL  30 mL Oral Daily PRN Jimmy FootmanAndrea Hernandez-Gonzalez, MD      . nicotine (NICODERM CQ - dosed in mg/24 hours) patch 21 mg  21 mg Transdermal Daily Jimmy FootmanAndrea Hernandez-Gonzalez, MD   21 mg at 07/23/16 0803  . ondansetron (ZOFRAN-ODT) disintegrating tablet 8 mg  8 mg Oral Q8H PRN Jimmy FootmanAndrea Hernandez-Gonzalez, MD      . traZODone (DESYREL) tablet 150 mg  150 mg Oral QHS PRN Jimmy FootmanAndrea Hernandez-Gonzalez, MD   150 mg at 07/22/16 2201   PTA Medications: Prescriptions Prior to Admission  Medication Sig Dispense Refill Last Dose  . ranitidine (ZANTAC) 150  MG tablet Take 150 mg by mouth 2 (two) times daily.       Patient Stressors: Financial difficulties Occupational concerns Other: AVH  Patient Strengths: Ability for insight Communication skills  Treatment Modalities: Medication Management, Group therapy, Case management,  1 to 1 session with clinician, Psychoeducation, Recreational therapy.   Physician Treatment Plan for Primary Diagnosis: Depressive disorder Long Term Goal(s): Improvement in symptoms so as ready for discharge Improvement in symptoms so as ready for discharge   Short Term Goals: Ability to identify changes in lifestyle to reduce recurrence of condition will improve Ability to verbalize feelings will improve Ability to disclose and discuss suicidal ideas Ability to demonstrate self-control will improve Ability to identify and develop effective coping behaviors will improve Ability to maintain clinical measurements within normal limits will improve Compliance with prescribed medications will improve Ability to identify triggers associated with substance abuse/mental health issues will improve Ability to identify changes in lifestyle to reduce recurrence of condition will improve Ability to demonstrate self-control will improve Ability to identify triggers associated with substance abuse/mental health issues will improve  Medication Management: Evaluate patient's response, side effects, and tolerance of medication regimen.  Therapeutic Interventions: 1 to 1 sessions, Unit Group sessions and Medication administration.  Evaluation of Outcomes: Progressing  Physician Treatment Plan for Secondary Diagnosis: Principal Problem:   Unspecified depressive disorder Active Problems:   Opioid withdrawal (HCC)   Opioid use disorder, severe, dependence (HCC)   Tobacco use disorder   Cocaine use disorder,  severe, dependence (HCC)   Alcohol use disorder, moderate, dependence (HCC)   Sedative, hypnotic or anxiolytic use  disorder, severe, dependence (HCC)  Long Term Goal(s): Improvement in symptoms so as ready for discharge Improvement in symptoms so as ready for discharge   Short Term Goals: Ability to identify changes in lifestyle to reduce recurrence of condition will improve Ability to verbalize feelings will improve Ability to disclose and discuss suicidal ideas Ability to demonstrate self-control will improve Ability to identify and develop effective coping behaviors will improve Ability to maintain clinical measurements within normal limits will improve Compliance with prescribed medications will improve Ability to identify triggers associated with substance abuse/mental health issues will improve Ability to identify changes in lifestyle to reduce recurrence of condition will improve Ability to demonstrate self-control will improve Ability to identify triggers associated with substance abuse/mental health issues will improve     Medication Management: Evaluate patient's response, side effects, and tolerance of medication regimen.  Therapeutic Interventions: 1 to 1 sessions, Unit Group sessions and Medication administration.  Evaluation of Outcomes: Progressing   RN Treatment Plan for Primary Diagnosis: Depressive disorder Long Term Goal(s): Knowledge of disease and therapeutic regimen to maintain health will improve  Short Term Goals: Ability to demonstrate self-control and Ability to identify and develop effective coping behaviors will improve  Medication Management: RN will administer medications as ordered by provider, will assess and evaluate patient's response and provide education to patient for prescribed medication. RN will report any adverse and/or side effects to prescribing provider.  Therapeutic Interventions: 1 on 1 counseling sessions, Psychoeducation, Medication administration, Evaluate responses to treatment, Monitor vital signs and CBGs as ordered, Perform/monitor CIWA, COWS,  AIMS and Fall Risk screenings as ordered, Perform wound care treatments as ordered.  Evaluation of Outcomes: Progressing   LCSW Treatment Plan for Primary Diagnosis: Depressive disorder Long Term Goal(s): Safe transition to appropriate next level of care at discharge, Engage patient in therapeutic group addressing interpersonal concerns.  Short Term Goals: Engage patient in aftercare planning with referrals and resources, Increase emotional regulation and Identify triggers associated with mental health/substance abuse issues  Therapeutic Interventions: Assess for all discharge needs, 1 to 1 time with Social worker, Explore available resources and support systems, Assess for adequacy in community support network, Educate family and significant other(s) on suicide prevention, Complete Psychosocial Assessment, Interpersonal group therapy.  Evaluation of Outcomes: Progressing    Recreational Therapy Treatment Plan for Primary Diagnosis: Bipolar I disorder, current or most recent episode depressed, with psychotic features (HCC) Long Term Goal(s): Patient will participate in recreation therapy treatment in at least 2 group sessions without prompting from LRT  Short Term Goals: Increase healthy coping skills, Increase stress management skills  Treatment Modalities: Group Therapy and Individual Treatment Sessions  Therapeutic Interventions: Psychoeducation  Evaluation of Outcomes: Progressing   Progress in Treatment: Attending groups: Yes. Participating in groups: Yes. Taking medication as prescribed: Yes. Toleration medication: Yes. Family/Significant other contact made: No, will contact:  CSW assessing proper contacts. Patient understands diagnosis: Yes. Discussing patient identified problems/goals with staff: Yes. Medical problems stabilized or resolved: Yes. Denies suicidal/homicidal ideation: Yes. Issues/concerns per patient self-inventory: No.  New problem(s) identified: Yes,  Describe:  substance abuse treatment.  New Short Term/Long Term Goal(s): Pt's goal is to develop coping mechanisms and find substance abuse treatment.  Discharge Plan or Barriers: CSW assessing proper contacts.  Reason for Continuation of Hospitalization: Delusions  Depression Suicidal ideation  Estimated Length of Stay: 3-5 days   Attendees: Patient:  Steve Davenport 07/23/2016 10:45 AM  Physician: Dr. Kristine Linea, MD  07/23/2016 10:45 AM  Nursing: Hulan Amato, RN  07/23/2016 10:45 AM  RN Care Manager: 07/23/2016 10:45 AM  Social Worker: Hampton Abbot, MSW, LCSW-A 07/23/2016 10:45 AM  Recreational Therapist: Princella Ion, LRT/CTRS  07/23/2016 10:45 AM  Other:  07/23/2016 10:45 AM  Other:  07/23/2016 10:45 AM  Other: 07/23/2016 10:45 AM    Scribe for Treatment Team: Lynden Oxford, LCSWA 07/23/2016 10:45 AM
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
## 2016-07-23 NOTE — [facility] Group Notes (Signed)
  [facility] LCSW Group Therapy Note  Date/Time: 07/23/16, 0930  Type of Therapy/Topic:  Group Therapy:  Emotion Regulation  Participation Level:  Active   Mood: pleasant  Description of Group:    The purpose of this group is to assist patients in learning to regulate negative emotions and experience positive emotions. Patients will be guided to discuss ways in which they have been vulnerable to their negative emotions. These vulnerabilities will be juxtaposed with experiences of positive emotions or situations, and patients challenged to use positive emotions to combat negative ones. Special emphasis will be placed on coping with negative emotions in conflict situations, and patients will process healthy conflict resolution skills.  Therapeutic Goals: 1. Patient will identify two positive emotions or experiences to reflect on in order to balance out negative emotions:  2. Patient will label two or more emotions that they find the most difficult to experience:  3. Patient will be able to demonstrate positive conflict resolution skills through discussion or role plays:   Summary of Patient Progress: Pt was only attendee to stay for entire group.  Pt shared that anxiety and anger are two emotions most difficult for him to experience and also shared about using substances as an attempt to manage his anxiety.  Pt very focused on medication solutions to all problems but was able to discuss other, non-medication related possibilities when prompted by CSW.  Pt identified positive emotion when he used to work and feel like he accomplished something.       Therapeutic Modalities:   Cognitive Behavioral Therapy Feelings Identification Dialectical Behavioral Therapy  Daleen SquibbGreg Quentavious Rittenhouse, LCSW

## 2016-07-23 NOTE — Progress Notes (Signed)
Recreation Therapy Notes  INPATIENT RECREATION THERAPY ASSESSMENT  Patient Details Name: Steve Davenport MRN: [id number] DOB: 10-May-1976 Today's Date: 07/23/2016  Patient Stressors: Family, Death, Other (Comment) (Not good relationship with family - brother in prison and mother makes patient's problem's worse; dad died recently; patient's health; lost house and is now homeless for the frist time ever)  Coping Skills:   Isolate, Arguments, Substance Abuse, Avoidance, Exercise, Art/Dance, Talking, Music, Sports, Other (Comment) Librarian, academic(Fishing, camping)  Personal Challenges: Anger, Communication, Concentration, Decision-Making, Expressing Yourself, Problem-Solving, Social Interaction, Stress Management, Substance Abuse, Time Management, Trusting Others  Leisure Interests (2+):  Nature - Therapist, musicishing, Technical brewerature - EconomistCamping  Awareness of Community Resources:  No  Community Resources:     Current Use:    If no, Barriers?:    Patient Strengths:  Strong, survivor  Patient Identified Areas of Improvement:  Get mental health right and find long term treatment  Current Recreation Participation:  Getting high  Patient Goal for Hospitalization:  To heal and feel normal agana nd find treatment  [place]  [place]   Current [place] (including self-harm):  Yes ("It's in and out")  Current HI:  No  Consent to Intern Participation: N/A   Jacquelynn CreeGreene,Lancelot Alyea M, LRT/CTRS 07/23/2016, 4:02 PM

## 2016-07-23 NOTE — Plan of Care (Signed)
Problem: Safety: Goal: Ability to remain free from injury will improve Outcome: Progressing No harm reported or observed   

## 2016-07-23 NOTE — Progress Notes (Signed)
Patient remains anxious and talkative. Pt continues to voice AV/H- see demons all around him and feels like snakes are crawling around In his stomach says he would like to cut them out. reports using hallucinogenic mushrooms several years ago but has stopped. Reports smoking marijuana daily. Pt states his [facility] hospitalization was [facility] several years ago and discharged on a lot medications. reports unable to afford medications after discharge. Reports having  HepC and a lot of other medical problems. Non productive cough noted.  Clinical support provided. Pt encouraged to express feelings and concerns. Ibuprofen 600 mg po given at 2000 and Vistaril 50 mg po given at 2100 PRN. Meds adm with education. Safety maintained w/ q 15 min checks. No voiced thoughts of hurting himself. Will continue to monitor for behavior.

## 2016-07-23 NOTE — Progress Notes (Signed)
Recreation Therapy Notes  Date: 03.14.18 Time: 1:00 pm Location: Craft Room  Group Topic: Self-esteem  Goal Area(s) Addresses:  Patient will write at least one positive trait about self. Patient will verbalize benefit of having healthy self-esteem.  Behavioral Response: Attentive  Intervention: I Am  Activity: Patients were given a worksheet with the letter I on it and were instructed to write as many positive traits about themselves inside the letter.  Education: LRT educated patients on ways they can increase their self-esteem.  Education Outcome: In group clarification offered  Clinical Observations/Feedback: Patient arrived to group at approximately 1:30 pm. LRT explained activity. Patient worked on Film/video editorworksheet. Patient did not contribute to group discussion.  Jacquelynn CreeGreene,Salayah Meares M, LRT/CTRS 07/23/2016 2:04 PM

## 2016-07-23 NOTE — Plan of Care (Signed)
Problem: Coping: Goal: Ability to cope will improve Outcome: Not Met (add Reason) Patient remains anxious. Hyper verbal.  Attends group. Limited interaction with peers.  C/o anxious and genl pain. Vistaril 50 mg po and Ibuprofen 500 mg po given PRN. Non productive cough noted. +AV/H. No voiced thoughts of hurting himself. Safety maintained with q 15 min checks.

## 2016-07-23 NOTE — [facility] Group Notes (Signed)
[facility] Group Notes:  (Nursing/MHT/Case Management/Adjunct)  Date:  07/23/2016  Time:  12:36 AM  Type of Therapy:  Psychoeducational Skills  Participation Level:  Minimal  Participation Quality:  Appropriate  Affect:  Appropriate  Cognitive:  Alert  Insight:  Good  Engagement in Group:  Supportive  Modes of Intervention:  Support  Summary of Progress/Problems:  Steve NeerJackie L Maxx Davenport 07/23/2016, 12:36 AM

## 2016-07-24 MED ORDER — QUETIAPINE FUMARATE 200 MG PO TABS
200.0000 mg | ORAL_TABLET | Freq: Every day | ORAL | Status: DC
  Administered 2016-07-24: 200 mg via ORAL
  Filled 2016-07-24: qty 1

## 2016-07-24 MED ORDER — TUBERCULIN PPD 5 UNIT/0.1ML ID SOLN
5.0000 [IU] | Freq: Once | INTRADERMAL | Status: AC
  Administered 2016-07-24: 5 [IU] via INTRADERMAL
  Filled 2016-07-24: qty 0.1

## 2016-07-24 MED ORDER — PRAZOSIN HCL 2 MG PO CAPS
2.0000 mg | ORAL_CAPSULE | Freq: Two times a day (BID) | ORAL | Status: DC
  Administered 2016-07-24 – 2016-07-27 (×7): 2 mg via ORAL
  Filled 2016-07-24 (×7): qty 1

## 2016-07-24 MED ORDER — FLUOXETINE HCL 20 MG PO CAPS
20.0000 mg | ORAL_CAPSULE | Freq: Every day | ORAL | Status: DC
  Administered 2016-07-24 – 2016-07-27 (×4): 20 mg via ORAL
  Filled 2016-07-24 (×4): qty 1

## 2016-07-24 NOTE — Plan of Care (Signed)
Problem: Safety: Goal: Ability to remain free from injury will improve Outcome: Not Met (add Reason) Patient remains anxious. Flat affect. Attends group. Interacting with peers and staff. Med compliant. No PRNs given. ABT for URI. Non prod cough noted. No behavior issues noted. No voiced thoughts of hurting himself. Safety maintained with q 15 min checks.

## 2016-07-24 NOTE — Progress Notes (Signed)
Affect is sad.  Rates depression, anxiety and hopelessness as 8/10.  Verbalizes that "I have SI off and on throughout the day." Verbally contracts for safety.   Further states that he has racing thoughts and they keep him from concentrating and that he has flashbacks but he cannot pinpoint any particular thing that causes them.  Medication and group compliant.  Support and encouragement offered.  Safety maintained.

## 2016-07-24 NOTE — [facility] Group Notes (Signed)
[facility] LCSW Group Therapy Note  Date/Time: 07/24/16, 0930  Type of Therapy/Topic:  Group Therapy:  Balance in Life  Participation Level:  active  Description of Group:    This group will address the concept of balance and how it feels and looks when one is unbalanced. Patients will be encouraged to process areas in their lives that are out of balance, and identify reasons for remaining unbalanced. Facilitators will guide patients utilizing problem- solving interventions to address and correct the stressor making their life unbalanced. Understanding and applying boundaries will be explored and addressed for obtaining  and maintaining a balanced life. Patients will be encouraged to explore ways to assertively make their unbalanced needs known to significant others in their lives, using other group members and facilitator for support and feedback.  Therapeutic Goals: 1. Patient will identify two or more emotions or situations they have that consume much of in their lives. 2. Patient will identify signs/triggers that life has become out of balance:  3. Patient will identify two ways to set boundaries in order to achieve balance in their lives:  4. Patient will demonstrate ability to communicate their needs through discussion and/or role plays  Summary of Patient Progress: Pt identified areas that are out of balance in his life as mental, lack of work, and lack of support/family connection.  Pt share with the group regarding each of these.          Therapeutic Modalities:   Cognitive Behavioral Therapy Solution-Focused Therapy Assertiveness Training  Daleen SquibbGreg Skai [place], [place]

## 2016-07-24 NOTE — Plan of Care (Signed)
Problem: [facility] Participation in Recreation Therapeutic Interventions Goal: STG-Patient will identify at least five coping skills for ** STG: Coping Skills - Within 4 treatment sessions, patient will verbalize at least 5 coping skills for substance abuse in each of 2 treatment sessions to decrease substance abuse.  Outcome: Progressing Treatment Session 1; Completed 1 out of 2: At approximately 11:45 am, LRT met with patient in consultation room. Patient verbalized 5 coping skills for substance abuse. LRT educated patient on leisure and why it is important for him to implement it into his schedule. LRT educated and provided patient with blank schedules to help him plan his day and try to avoid using substances. LRT educated patient on healthy support systems.   M , LRT/CTRS 03.15.18 2:30 pm Goal: STG-Other Recreation Therapy Goal (Specify) STG: Stress Management - Within 4 treatment sessions, patient will verbalize understanding of the stress management techniques in each of 2 treatment sessions to increase stress management skills.  Outcome: Progressing Treatment Session 1; Completed 1 out of 2: At approximately 11:45 am, LRT met with patient in consultation room. LRT educated and provided patient with handouts on stress management techniques. Patient verbalized understanding. LRT encouraged patient to read over and practice the stress management techniques.   M , LRT/CTRS  03.15.18 2:34 pm   

## 2016-07-24 NOTE — Progress Notes (Signed)
Recreation Therapy Notes  Date: 03.15.18 Time: 1:00 pm Location: Craft Room  Group Topic: Leisure Education  Goal Area(s) Addresses:  Patient will identify activities for each letter of the alphabet. Patient will verbalize ability to integrate positive leisure into life post d/c. Patient will verbalize ability to use leisure as a Associate Professorcoping skill.  Behavioral Response: Attentive, Interactive  Intervention: Leisure Tyson FoodsBucket List  Activity: Patients were given a Leisure Geneticist, molecularBucket List worksheet and were instructed to write healthy leisure activities they wanted to try and to write a leisure goal for the year.  Education: LRT educated patients on what they need to participate in leisure.  Education Outcome: Acknowledges education/In group clarification offered  Clinical Observations/Feedback: Patient wrote leisure activities. Patient contributed to group discussion by stating some of the leisure activities he wanted to try, and what makes leisure a good coping skill.   Jacquelynn CreeGreene,Shyloh Derosa M, LRT/CTRS 07/24/2016 1:54 PM

## 2016-07-24 NOTE — Progress Notes (Signed)
[facility] MD Progress Note  07/24/2016 9:39 AM Riaan Toledo  MRN:  [id number]  Subjective:   Mr. Steve Davenport is a 41 year old male with a history of bipolar illness and substance abuse admitted for suicidal ideation, paranoia, auditory hallucinations in the context of medication noncompliance and withdrawal from Suboxone.  07/24/2016. Mr. Edgerly reports little improvement today. He could not fall asleep until 3:00 in the morning and is a little groggy. He still feels depressed, very anxious, and suicidal. He complains of upper respiratory infection symptoms. He is a rather somatic but does not report symptoms suggestive of opiate withdrawal.. He came to the emergency room initially with complaints of abdominal pain and hematuria suggestive of kidney stones. He accepts medications and tolerates them well. He does participate in programming.  Per nursing: Goal: Ability to remain free from injury will improve Outcome: Not Met (add Reason) Patient remains anxious. Flat affect. Attends group. Interacting with peers and staff. Med compliant. No PRNs given. ABT for URI. Non prod cough noted. No behavior issues noted. No voiced thoughts of hurting himself. Safety maintained with q 15 min checks.   Principal Problem: Bipolar I disorder, current or most recent episode depressed, with psychotic features [facility]) Diagnosis:   Patient Active Problem List   Diagnosis Date Noted  . Bipolar I disorder, current or most recent episode depressed, with psychotic features ([place]) [F31.5] 07/22/2016  . Opioid withdrawal ([place]) [F11.23] 07/22/2016  . Opioid use disorder, severe, dependence ([place]) [F11.20] 07/22/2016  . Tobacco use disorder [F17.200] 07/22/2016  . Sedative, hypnotic or anxiolytic use disorder, severe, dependence ([place]) [F13.20] 07/22/2016   Total Time spent with patient: 20 minutes  Past Psychiatric History: bipolar disorder, substance abuse.  Past Medical History:  Past Medical History:  Diagnosis Date  .  Anxiety   . Bipolar disorder ([place])   . Depression   . Kidney stone   . Mental health disorder   . Polysubstance abuse    History reviewed. No pertinent surgical history. Family History: History reviewed. No pertinent family history. Family Psychiatric  History: bipolar disorder, two completed suicides. Social History:  History  Alcohol Use No     History  Drug Use No    Social History   Social History  . Marital status: Single    Spouse name: N/A  . Number of children: N/A  . Years of education: N/A   Social History Main Topics  . Smoking status: Current Every Day Smoker    Packs/day: 0.50    Types: Cigarettes  . Smokeless tobacco: Never Used  . Alcohol use No  . Drug use: No  . Sexual activity: Not Asked   Other Topics Concern  . None   Social History Narrative  . None   Additional Social History:    History of alcohol / drug use?: Yes Negative Consequences of Use: Financial, Personal relationships, Work / School                    Sleep: Poor  Appetite:  Fair  Current Medications: Current Facility-Administered Medications  Medication Dose Route Frequency Provider Last Rate Last Dose  . acetaminophen (TYLENOL) tablet 1,000 mg  1,000 mg Oral Q6H PRN Hildred Priest, MD      . alum & mag hydroxide-simeth (MAALOX/MYLANTA) 200-200-20 MG/5ML suspension 30 mL  30 mL Oral Q4H PRN Hildred Priest, MD      . azithromycin (ZITHROMAX) tablet 250 mg  250 mg Oral Daily Clovis Fredrickson, MD      .  benzonatate (TESSALON) capsule 200 mg  200 mg Oral TID Clovis Fredrickson, MD   200 mg at 07/23/16 2127  . cyclobenzaprine (FLEXERIL) tablet 5 mg  5 mg Oral TID PRN Hildred Priest, MD      . fluvoxaMINE (LUVOX) tablet 50 mg  50 mg Oral QHS Doranne Schmutz B Sonu Kruckenberg, MD   50 mg at 07/23/16 2127  . hydrOXYzine (ATARAX/VISTARIL) tablet 50 mg  50 mg Oral TID PRN Hildred Priest, MD   50 mg at 07/22/16 2159  . ibuprofen  (ADVIL,MOTRIN) tablet 600 mg  600 mg Oral Q6H PRN Hildred Priest, MD   600 mg at 07/22/16 2000  . loperamide (IMODIUM) capsule 4 mg  4 mg Oral PRN Hildred Priest, MD      . magnesium hydroxide (MILK OF MAGNESIA) suspension 30 mL  30 mL Oral Daily PRN Hildred Priest, MD      . nicotine (NICODERM CQ - dosed in mg/24 hours) patch 21 mg  21 mg Transdermal Daily Hildred Priest, MD   21 mg at 07/23/16 0803  . ondansetron (ZOFRAN-ODT) disintegrating tablet 8 mg  8 mg Oral Q8H PRN Hildred Priest, MD      . prazosin (MINIPRESS) capsule 1 mg  1 mg Oral BID Clovis Fredrickson, MD   1 mg at 07/23/16 1652  . QUEtiapine (SEROQUEL) tablet 100 mg  100 mg Oral QHS Clovis Fredrickson, MD   100 mg at 07/23/16 2127  . QUEtiapine (SEROQUEL) tablet 25 mg  25 mg Oral TID Clovis Fredrickson, MD   25 mg at 07/23/16 1652  . traZODone (DESYREL) tablet 150 mg  150 mg Oral QHS PRN Hildred Priest, MD   150 mg at 07/22/16 2201  . tuberculin injection 5 Units  5 Units Intradermal Once Talicia Sui B Neftaly Swiss, MD        Lab Results: No results found for this or any previous visit (from the past 48 hour(s)).  Blood Alcohol level:  Lab Results  Component Value Date   ETH <5 57/05/7791    Metabolic Disorder Labs: No results found for: HGBA1C, MPG No results found for: PROLACTIN No results found for: CHOL, TRIG, HDL, CHOLHDL, VLDL, LDLCALC  Physical Findings: AIMS:  , ,  ,  ,    CIWA:    COWS:     Musculoskeletal: Strength & Muscle Tone: within normal limits Gait & Station: normal Patient leans: N/A  Psychiatric Specialty Exam: Physical Exam  Nursing note and vitals reviewed. Psychiatric: His mood appears anxious. His speech is rapid and/or pressured. He is hyperactive and actively hallucinating. Cognition and memory are normal. He expresses impulsivity. He exhibits a depressed mood. He expresses suicidal ideation. He expresses suicidal plans.     Review of Systems  Respiratory: Positive for cough.   Genitourinary: Positive for hematuria.  Psychiatric/Behavioral: Positive for depression, hallucinations, substance abuse and suicidal ideas.  All other systems reviewed and are negative.   Blood pressure 116/68, pulse 91, temperature 98.1 F (36.7 C), resp. rate 20, height '5\' 9"'$  (1.753 m), weight 69.4 kg (153 lb), SpO2 99 %.Body mass index is 22.59 kg/m.  General Appearance: Casual  Eye Contact:  Good  Speech:  Pressured  Volume:  Normal  Mood:  Anxious, Depressed and Hopeless  Affect:  Labile  Thought Process:  Goal Directed and Descriptions of Associations: Intact  Orientation:  Full (Time, Place, and Person)  Thought Content:  Delusions, Hallucinations: Auditory and Paranoid Ideation  Suicidal Thoughts:  Yes.  with intent/plan  Homicidal  Thoughts:  No  Memory:  Immediate;   Fair Recent;   Fair Remote;   Fair  Judgement:  Poor  Insight:  Lacking  Psychomotor Activity:  Restlessness  Concentration:  Concentration: Fair and Attention Span: Fair  Recall:  [employer] of Knowledge:  Fair  Language:  Fair  Akathisia:  No  Handed:  Right  AIMS (if indicated):     Assets:  Communication Skills Desire for Improvement Resilience  ADL's:  Intact  Cognition:  WNL  Sleep:  Number of Hours: 5.3     Treatment Plan Summary: Daily contact with patient to assess and evaluate symptoms and progress in treatment and Medication management   Mr. Clabo is a 41 year old male with a history of depression, anxiety, mood instability, and substance abuse admitted for worsening of depression and suicidal ideation in the context of major loss, severe social stressors, and substance abuse.  1. Suicidal ideation. The patient is able to contract for safety in the hospital.  2. Mood and psychosis. We started Luvox for depression and anxiety and Seroquel for psychosis.  3. Opiate dependence. The patient claims to be withdrawing from  Suboxone. We will offer symptomatic treatment.  4. Smoking. Nicotine patch is available.  5. Insomnia. Trazodone is available.  6. PTSD. We started Minipress 1 mg twice daily.   7. Substance abuse treatment. The patient desires residential treatment. He was accepted at [facility]. PPD placed.  8. Metabolic syndrome monitoring. Lipid panel, TSH, hemoglobin A1c are pending.   9. EKG. Pending.   10. Upper respiratory infection. He was started on a Z-Pak and Tessalon Perles.   11. Hematuria. CT scan of the abdomen performed in the emergency room revealed no abnormalities. Urine culture was negative.  12. Disposition. He will be discharged to [facility] program.   Orson Slick, MD 07/24/2016, 9:39 AM

## 2016-07-25 LAB — TSH: TSH: 2.022 u[IU]/mL (ref 0.350–4.500)

## 2016-07-25 LAB — LIPID PANEL
CHOL/HDL RATIO: 3 ratio
Cholesterol: 164 mg/dL (ref 0–200)
HDL: 54 mg/dL (ref >40)
LDL CALC: 85 mg/dL (ref 0–99)
Triglycerides: 127 mg/dL (ref <150)
VLDL: 25 mg/dL (ref 0–40)

## 2016-07-25 MED ORDER — FLUOXETINE HCL 20 MG PO CAPS: 20.0000 mg | ORAL_CAPSULE | Freq: Every day | ORAL | 1 refills | Status: DC

## 2016-07-25 MED ORDER — QUETIAPINE FUMARATE 25 MG PO TABS: 25.0000 mg | ORAL_TABLET | Freq: Three times a day (TID) | ORAL | 1 refills | Status: DC

## 2016-07-25 MED ORDER — PRAZOSIN HCL 2 MG PO CAPS: 2.0000 mg | ORAL_CAPSULE | Freq: Two times a day (BID) | ORAL | 1 refills | Status: DC

## 2016-07-25 MED ORDER — QUETIAPINE FUMARATE 300 MG PO TABS: 300.0000 mg | ORAL_TABLET | Freq: Every day | ORAL | 1 refills | Status: DC

## 2016-07-25 MED ORDER — QUETIAPINE FUMARATE 200 MG PO TABS
300.0000 mg | ORAL_TABLET | Freq: Every day | ORAL | Status: DC
  Administered 2016-07-25 – 2016-07-27 (×3): 300 mg via ORAL
  Filled 2016-07-25 (×4): qty 1

## 2016-07-25 NOTE — Plan of Care (Signed)
Problem: [facility] Participation in Recreation Therapeutic Interventions Goal: STG-Patient will identify at least five coping skills for ** STG: Coping Skills - Within 4 treatment sessions, patient will verbalize at least 5 coping skills for substance abuse in each of 2 treatment sessions to decrease substance abuse.  Outcome: Completed/Met Date Met: 07/25/16 Treatment Session 2; Completed 2 out of 2: At approximately 2:50 pm, LRT met with patient in consultation room. Patient verbalized 5 coping skills for substance abuse. LRT encouraged patient to use his coping skills instead of turning to substances.  Leonette Monarch, LRT/CTRS 03.16.18 3:19 pm Goal: STG-Other Recreation Therapy Goal (Specify) STG: Stress Management - Within 4 treatment sessions, patient will verbalize understanding of the stress management techniques in each of 2 treatment sessions to increase stress management skills.  Outcome: Completed/Met Date Met: 07/25/16 Treatment Session 2; Completed 2 out of 2: At approximately 2:50 pm, LRT met with patient in consultation room. Patient reported he read over and practiced the stress management techniques. Patient verbalized understanding and reported the techniques were helpful. LRT encouraged patient to continue practicing the stress management techniques.  Leonette Monarch, LRT/CTRS 03.16.18 3:20 pm

## 2016-07-25 NOTE — Progress Notes (Signed)
Recreation Therapy Notes  Date: 03.16.18 Time: 1:00 pm Location: Craft Room  Group Topic: Social Skills  Goal Area(s) Addresses:  Patient will effectively work with peer towards shared goal. Patient will identify skill used to make activity successful. Patient will identify how skills used during activity can be used to reach post d/c goals.  Behavioral Response: Attentive, Interactive  Intervention: Life Boat  Activity: Patients were given a scenario of [place] and being on a Leisure centre manageryacht exploring. While on the yacht, there was a leak and the patients were in charge of dividing people to go on a bigger, faster boat or a smaller raft. Patients were given a list on 16 people (Marden NobleJimmy Fallon, bus driver, Curatormechanic, etc.) and were instructed to chose 8 people to go with them on the bigger boat and 8 people to go on the smaller boat.  Education: LRT educated patients on healthy support systems.  Education Outcome: Acknowledges education/In group clarification offered   Clinical Observations/Feedback: Patient worked with peers towards goal. Patient used effective communication, problem solving, and Forensic psychologistteamwork skills. Patient did not contribute to group discussion.  Jacquelynn CreeGreene,Shadrack Brummitt M, LRT/CTRS 07/25/2016 2:01 PM

## 2016-07-25 NOTE — Progress Notes (Signed)
D: Pt denies SI/HI/AVH. Pt is cooperative, affect is blunted, no distress noted. Pt appears less anxious and he is interacting with peers and staff appropriately.  A: Pt was offered support and encouragement. Pt was given scheduled medications. Pt was encouraged to attend groups. Q 15 minute checks were done for safety.  R:Pt attends groups and interacts well with peers and staff. Pt is taking medication. Pt has no complaints.Pt receptive to treatment and safety maintained on unit.

## 2016-07-25 NOTE — Progress Notes (Signed)
[facility] MD Progress Note  07/25/2016 9:26 AM Steve Davenport  MRN:  [id number]  Subjective:   Steve Davenport is a 41 year old male with a history of bipolar illness and substance abuse admitted for suicidal ideation, paranoia, auditory hallucinations in the context of medication noncompliance and withdrawal from Suboxone.  07/24/2016. Steve Davenport reports little improvement today. He could not fall asleep until 3:00 in the morning and is a little groggy. He still feels depressed, very anxious, and suicidal. He complains of upper respiratory infection symptoms. He is a rather somatic but does not report symptoms suggestive of opiate withdrawal.. He came to the emergency room initially with complaints of abdominal pain and hematuria suggestive of kidney stones. He accepts medications and tolerates them well. He does participate in programming.  07/25/2016. Steve Davenport reports some improvement but his sleep is still interrupted. His mood is better, affect is brighter. He does not appear severely anxious. He has not been coughing anymore. His appetite is adequate. He accepts medications and tolerates them well. He attends groups. He will be transferred to [facility] substance abuse treatment program on Monday.  Per nursing: Goal: Ability to remain free from injury will improve Outcome: Not Met (add Reason) Patient remains anxious. Flat affect. Attends group. Interacting with peers and staff. Med compliant. No PRNs given. ABT for URI. Non prod cough noted. No behavior issues noted. No voiced thoughts of hurting himself. Safety maintained with q 15 min checks.   Principal Problem: Bipolar I disorder, current or most recent episode depressed, with psychotic features Steve Davenport) Diagnosis:   Patient Active Problem List   Diagnosis Date Noted  . Bipolar I disorder, current or most recent episode depressed, with psychotic features ([place]) [F31.5] 07/22/2016  . Opioid withdrawal ([place]) [F11.23] 07/22/2016  . Opioid use disorder,  severe, dependence ([place]) [F11.20] 07/22/2016  . Tobacco use disorder [F17.200] 07/22/2016  . Sedative, hypnotic or anxiolytic use disorder, severe, dependence ([place]) [F13.20] 07/22/2016   Total Time spent with patient: 20 minutes  Past Psychiatric History: bipolar disorder, substance abuse.  Past Medical History:  Past Medical History:  Diagnosis Date  . Anxiety   . Bipolar disorder ([place])   . Depression   . Kidney stone   . Mental health disorder   . Polysubstance abuse    History reviewed. No pertinent surgical history. Family History: History reviewed. No pertinent family history. Family Psychiatric  History: bipolar disorder, two completed suicides. Social History:  History  Alcohol Use No     History  Drug Use No    Social History   Social History  . Marital status: Single    Spouse name: N/A  . Number of children: N/A  . Years of education: N/A   Social History Main Topics  . Smoking status: Current Every Day Smoker    Packs/day: 0.50    Types: Cigarettes  . Smokeless tobacco: Never Used  . Alcohol use No  . Drug use: No  . Sexual activity: Not Asked   Other Topics Concern  . None   Social History Narrative  . None   Additional Social History:    History of alcohol / drug use?: Yes Negative Consequences of Use: Financial, Personal relationships, Work / School                    Sleep: Poor  Appetite:  Fair  Current Medications: Current Facility-Administered Medications  Medication Dose Route Frequency Provider Last Rate Last Dose  . acetaminophen (TYLENOL) tablet 1,000 mg  1,000 mg Oral Q6H PRN Hildred Priest, MD      . alum & mag hydroxide-simeth (MAALOX/MYLANTA) 200-200-20 MG/5ML suspension 30 mL  30 mL Oral Q4H PRN Hildred Priest, MD      . azithromycin (ZITHROMAX) tablet 250 mg  250 mg Oral Daily Babacar Haycraft B Ilo Beamon, MD   250 mg at 07/25/16 0901  . benzonatate (TESSALON) capsule 200 mg  200 mg Oral TID Clovis Fredrickson, MD   200 mg at 07/25/16 0901  . cyclobenzaprine (FLEXERIL) tablet 5 mg  5 mg Oral TID PRN Hildred Priest, MD      . FLUoxetine (PROZAC) capsule 20 mg  20 mg Oral QHS Jaxon Mynhier B Rowynn Mcweeney, MD   20 mg at 07/24/16 2145  . hydrOXYzine (ATARAX/VISTARIL) tablet 50 mg  50 mg Oral TID PRN Hildred Priest, MD   50 mg at 07/22/16 2159  . ibuprofen (ADVIL,MOTRIN) tablet 600 mg  600 mg Oral Q6H PRN Hildred Priest, MD   600 mg at 07/22/16 2000  . loperamide (IMODIUM) capsule 4 mg  4 mg Oral PRN Hildred Priest, MD      . magnesium hydroxide (MILK OF MAGNESIA) suspension 30 mL  30 mL Oral Daily PRN Hildred Priest, MD      . nicotine (NICODERM CQ - dosed in mg/24 hours) patch 21 mg  21 mg Transdermal Daily Hildred Priest, MD   21 mg at 07/25/16 0901  . ondansetron (ZOFRAN-ODT) disintegrating tablet 8 mg  8 mg Oral Q8H PRN Hildred Priest, MD      . prazosin (MINIPRESS) capsule 2 mg  2 mg Oral BID Clovis Fredrickson, MD   2 mg at 07/25/16 0901  . QUEtiapine (SEROQUEL) tablet 200 mg  200 mg Oral QHS Clovis Fredrickson, MD   200 mg at 07/24/16 2145  . QUEtiapine (SEROQUEL) tablet 25 mg  25 mg Oral TID Clovis Fredrickson, MD   25 mg at 07/25/16 0901  . traZODone (DESYREL) tablet 150 mg  150 mg Oral QHS PRN Hildred Priest, MD   150 mg at 07/22/16 2201  . tuberculin injection 5 Units  5 Units Intradermal Once Clovis Fredrickson, MD   5 Units at 07/24/16 1359    Lab Results:  Results for orders placed or performed during the [place] encounter of 07/22/16 (from the past 48 hour(s))  Lipid panel     Status: None   Collection Time: 07/25/16  6:40 AM  Result Value Ref Range   Cholesterol 164 0 - 200 mg/dL   Triglycerides 127 <150 mg/dL   HDL 54 >40 mg/dL   Total CHOL/HDL Ratio 3.0 RATIO   VLDL 25 0 - 40 mg/dL   LDL Cholesterol 85 0 - 99 mg/dL    Comment:        Total Cholesterol/HDL:CHD Risk Coronary Heart  Disease Risk Table                     Men   Women  1/2 Average Risk   3.4   3.3  Average Risk       5.0   4.4  2 X Average Risk   9.6   7.1  3 X Average Risk  23.4   11.0        Use the calculated Patient Ratio above and the CHD Risk Table to determine the patient's CHD Risk.        ATP III CLASSIFICATION (LDL):  <100     mg/dL   Optimal  100-129  mg/dL   Near or Above                    Optimal  130-159  mg/dL   Borderline  160-189  mg/dL   High  >190     mg/dL   Very High   TSH     Status: None   Collection Time: 07/25/16  6:40 AM  Result Value Ref Range   TSH 2.022 0.350 - 4.500 uIU/mL    Comment: Performed by a 3rd Generation assay with a functional sensitivity of <=0.01 uIU/mL.    Blood Alcohol level:  Lab Results  Component Value Date   ETH <5 95/01/3266    Metabolic Disorder Labs: No results found for: HGBA1C, MPG No results found for: PROLACTIN Lab Results  Component Value Date   CHOL 164 07/25/2016   TRIG 127 07/25/2016   HDL 54 07/25/2016   CHOLHDL 3.0 07/25/2016   VLDL 25 07/25/2016   LDLCALC 85 07/25/2016    Physical Findings: AIMS:  , ,  ,  ,    CIWA:    COWS:     Musculoskeletal: Strength & Muscle Tone: within normal limits Gait & Station: normal Patient leans: N/A  Psychiatric Specialty Exam: Physical Exam  Nursing note and vitals reviewed. Psychiatric: His mood appears anxious. His speech is rapid and/or pressured. He is hyperactive and actively hallucinating. Cognition and memory are normal. He expresses impulsivity. He exhibits a depressed mood. He expresses suicidal ideation. He expresses suicidal plans.    Review of Systems  Respiratory: Positive for cough.   Genitourinary: Positive for hematuria.  Psychiatric/Behavioral: Positive for depression, hallucinations, substance abuse and suicidal ideas.  All other systems reviewed and are negative.   Blood pressure 117/76, pulse (!) 105, temperature 97.7 F (36.5 C), temperature source  Oral, resp. rate 18, height _0  (1.753 m), weight 69.4 kg (153 lb), SpO2 99 %.Body mass index is 22.59 kg/m.  General Appearance: Casual  Eye Contact:  Good  Speech:  Pressured  Volume:  Normal  Mood:  Anxious, Depressed and Hopeless  Affect:  Labile  Thought Process:  Goal Directed and Descriptions of Associations: Intact  Orientation:  Full (Time, Place, and Person)  Thought Content:  Delusions, Hallucinations: Auditory and Paranoid Ideation  Suicidal Thoughts:  Yes.  with intent/plan  Homicidal Thoughts:  No  Memory:  Immediate;   Fair Recent;   Fair Remote;   Fair  Judgement:  Poor  Insight:  Lacking  Psychomotor Activity:  Restlessness  Concentration:  Concentration: Fair and Attention Span: Fair  Recall:  [employer] of Knowledge:  Fair  Language:  Fair  Akathisia:  No  Handed:  Right  AIMS (if indicated):     Assets:  Communication Skills Desire for Improvement Resilience  ADL's:  Intact  Cognition:  WNL  Sleep:  Number of Hours: 7     Treatment Plan Summary: Daily contact with patient to assess and evaluate symptoms and progress in treatment and Medication management   Mr. Ellinwood is a 41 year old male with a history of depression, anxiety, mood instability, and substance abuse admitted for worsening of depression and suicidal ideation in the context of major loss, severe social stressors, and substance abuse.  1. Suicidal ideation. The patient is able to contract for safety in the [place].  2. Mood and psychosis. We started Luvox for depression and anxiety but switched it to Prozac due to cost. The patient is uninsured. We added Seroquel for  psychosis.  3. Opiate dependence. The patient claims to be withdrawing from Suboxone. We will offer symptomatic treatment.  4. Smoking. Nicotine patch is available.  5. Insomnia. Trazodone is available.  6. PTSD. We increased Minipress to 2 mg twice daily.   7. Substance abuse treatment. The patient desires  residential treatment. He was accepted at [facility]. PPD placed.  8. Metabolic syndrome monitoring. Lipid panel and TSH are normal, hemoglobin A1c are pending.   9. EKG. Normal sinus rhythm. QTC 410.   10. Upper respiratory infection. He was started on a Z-Pak and Tessalon Perles.   11. Hematuria. CT scan of the abdomen performed in the emergency room revealed no abnormalities. Urine culture was negative.  12. Disposition. He will be discharged to [facility] program.   Orson Slick, MD 07/25/2016, 9:26 AM

## 2016-07-25 NOTE — Tx Team (Signed)
Interdisciplinary Treatment and Diagnostic Plan Update  07/25/2016 Time of Session: 10:30 AM Sharlet SalinaMichael Buehring MRN: [id number]  Principal Diagnosis: Bipolar I disorder, current or most recent episode depressed, with psychotic features (HCC)  Secondary Diagnoses: Principal Problem:   Bipolar I disorder, current or most recent episode depressed, with psychotic features (HCC) Active Problems:   Opioid withdrawal (HCC)   Opioid use disorder, severe, dependence (HCC)   Tobacco use disorder   Sedative, hypnotic or anxiolytic use disorder, severe, dependence (HCC)   Current Medications:  Current Facility-Administered Medications  Medication Dose Route Frequency Provider Last Rate Last Dose  . acetaminophen (TYLENOL) tablet 1,000 mg  1,000 mg Oral Q6H PRN Jimmy FootmanAndrea Hernandez-Gonzalez, MD      . alum & mag hydroxide-simeth (MAALOX/MYLANTA) 200-200-20 MG/5ML suspension 30 mL  30 mL Oral Q4H PRN Jimmy FootmanAndrea Hernandez-Gonzalez, MD      . azithromycin (ZITHROMAX) tablet 250 mg  250 mg Oral Daily Shari ProwsJolanta B Pucilowska, MD   250 mg at 07/25/16 0901  . benzonatate (TESSALON) capsule 200 mg  200 mg Oral TID Shari ProwsJolanta B Pucilowska, MD   200 mg at 07/25/16 0901  . cyclobenzaprine (FLEXERIL) tablet 5 mg  5 mg Oral TID PRN Jimmy FootmanAndrea Hernandez-Gonzalez, MD      . FLUoxetine (PROZAC) capsule 20 mg  20 mg Oral QHS Jolanta B Pucilowska, MD   20 mg at 07/24/16 2145  . hydrOXYzine (ATARAX/VISTARIL) tablet 50 mg  50 mg Oral TID PRN Jimmy FootmanAndrea Hernandez-Gonzalez, MD   50 mg at 07/22/16 2159  . ibuprofen (ADVIL,MOTRIN) tablet 600 mg  600 mg Oral Q6H PRN Jimmy FootmanAndrea Hernandez-Gonzalez, MD   600 mg at 07/22/16 2000  . loperamide (IMODIUM) capsule 4 mg  4 mg Oral PRN Jimmy FootmanAndrea Hernandez-Gonzalez, MD      . magnesium hydroxide (MILK OF MAGNESIA) suspension 30 mL  30 mL Oral Daily PRN Jimmy FootmanAndrea Hernandez-Gonzalez, MD      . nicotine (NICODERM CQ - dosed in mg/24 hours) patch 21 mg  21 mg Transdermal Daily Jimmy FootmanAndrea Hernandez-Gonzalez, MD   21 mg at 07/25/16  0901  . ondansetron (ZOFRAN-ODT) disintegrating tablet 8 mg  8 mg Oral Q8H PRN Jimmy FootmanAndrea Hernandez-Gonzalez, MD      . prazosin (MINIPRESS) capsule 2 mg  2 mg Oral BID Shari ProwsJolanta B Pucilowska, MD   2 mg at 07/25/16 0901  . QUEtiapine (SEROQUEL) tablet 25 mg  25 mg Oral TID Shari ProwsJolanta B Pucilowska, MD   25 mg at 07/25/16 0901  . QUEtiapine (SEROQUEL) tablet 300 mg  300 mg Oral QHS Jolanta B Pucilowska, MD      . traZODone (DESYREL) tablet 150 mg  150 mg Oral QHS PRN Jimmy FootmanAndrea Hernandez-Gonzalez, MD   150 mg at 07/22/16 2201  . tuberculin injection 5 Units  5 Units Intradermal Once Shari ProwsJolanta B Pucilowska, MD   5 Units at 07/24/16 1359   PTA Medications: Prescriptions Prior to Admission  Medication Sig Dispense Refill Last Dose  . ranitidine (ZANTAC) 150 MG tablet Take 150 mg by mouth 2 (two) times daily.       Patient Stressors: Financial difficulties Occupational concerns Other: AVH  Patient Strengths: Ability for insight Communication skills  Treatment Modalities: Medication Management, Group therapy, Case management,  1 to 1 session with clinician, Psychoeducation, Recreational therapy.   Physician Treatment Plan for Primary Diagnosis: Bipolar I disorder, current or most recent episode depressed, with psychotic features (HCC) Long Term Goal(s): Improvement in symptoms so as ready for discharge Improvement in symptoms so as ready for discharge   Short  Term Goals: Ability to identify changes in lifestyle to reduce recurrence of condition will improve Ability to verbalize feelings will improve Ability to disclose and discuss suicidal ideas Ability to demonstrate self-control will improve Ability to identify and develop effective coping behaviors will improve Ability to maintain clinical measurements within normal limits will improve Compliance with prescribed medications will improve Ability to identify triggers associated with substance abuse/mental health issues will improve Ability to  identify changes in lifestyle to reduce recurrence of condition will improve Ability to demonstrate self-control will improve Ability to identify triggers associated with substance abuse/mental health issues will improve  Medication Management: Evaluate patient's response, side effects, and tolerance of medication regimen.  Therapeutic Interventions: 1 to 1 sessions, Unit Group sessions and Medication administration.  Evaluation of Outcomes: Progressing  Physician Treatment Plan for Secondary Diagnosis: Principal Problem:   Bipolar I disorder, current or most recent episode depressed, with psychotic features (HCC) Active Problems:   Opioid withdrawal (HCC)   Opioid use disorder, severe, dependence (HCC)   Tobacco use disorder   Sedative, hypnotic or anxiolytic use disorder, severe, dependence (HCC)  Long Term Goal(s): Improvement in symptoms so as ready for discharge Improvement in symptoms so as ready for discharge   Short Term Goals: Ability to identify changes in lifestyle to reduce recurrence of condition will improve Ability to verbalize feelings will improve Ability to disclose and discuss suicidal ideas Ability to demonstrate self-control will improve Ability to identify and develop effective coping behaviors will improve Ability to maintain clinical measurements within normal limits will improve Compliance with prescribed medications will improve Ability to identify triggers associated with substance abuse/mental health issues will improve Ability to identify changes in lifestyle to reduce recurrence of condition will improve Ability to demonstrate self-control will improve Ability to identify triggers associated with substance abuse/mental health issues will improve     Medication Management: Evaluate patient's response, side effects, and tolerance of medication regimen.  Therapeutic Interventions: 1 to 1 sessions, Unit Group sessions and Medication  administration.  Evaluation of Outcomes: Progressing   RN Treatment Plan for Primary Diagnosis: Bipolar I disorder, current or most recent episode depressed, with psychotic features (HCC) Long Term Goal(s): Knowledge of disease and therapeutic regimen to maintain health will improve  Short Term Goals: Ability to demonstrate self-control and Ability to identify and develop effective coping behaviors will improve  Medication Management: RN will administer medications as ordered by provider, will assess and evaluate patient's response and provide education to patient for prescribed medication. RN will report any adverse and/or side effects to prescribing provider.  Therapeutic Interventions: 1 on 1 counseling sessions, Psychoeducation, Medication administration, Evaluate responses to treatment, Monitor vital signs and CBGs as ordered, Perform/monitor CIWA, COWS, AIMS and Fall Risk screenings as ordered, Perform wound care treatments as ordered.  Evaluation of Outcomes: Progressing   LCSW Treatment Plan for Primary Diagnosis: Bipolar I disorder, current or most recent episode depressed, with psychotic features (HCC) Long Term Goal(s): Safe transition to appropriate next level of care at discharge, Engage patient in therapeutic group addressing interpersonal concerns.  Short Term Goals: Engage patient in aftercare planning with referrals and resources, Increase emotional regulation and Identify triggers associated with mental health/substance abuse issues  Therapeutic Interventions: Assess for all discharge needs, 1 to 1 time with Social worker, Explore available resources and support systems, Assess for adequacy in community support network, Educate family and significant other(s) on suicide prevention, Complete Psychosocial Assessment, Interpersonal group therapy.  Evaluation of Outcomes: Progressing  Recreational Therapy Treatment Plan for Primary Diagnosis: Bipolar I disorder, current or  most recent episode depressed, with psychotic features (HCC) Long Term Goal(s): Patient will participate in recreation therapy treatment in at least 2 group sessions without prompting from LRT  Short Term Goals: Increase healthy coping skills, Increase stress management skills  Treatment Modalities: Group Therapy and Individual Treatment Sessions  Therapeutic Interventions: Psychoeducation  Evaluation of Outcomes: Progressing   Progress in Treatment: Attending groups: Yes. Participating in groups: Yes. Taking medication as prescribed: Yes. Toleration medication: Yes. Family/Significant other contact made: No, will contact:  CSW assessing proper contacts. Patient understands diagnosis: Yes. Discussing patient identified problems/goals with staff: Yes. Medical problems stabilized or resolved: Yes. Denies suicidal/homicidal ideation: Yes. Issues/concerns per patient self-inventory: No.  New problem(s) identified: Yes, Describe:  substance abuse treatment.  New Short Term/Long Term Goal(s): Pt's goal is to develop coping mechanisms and find substance abuse treatment.  Discharge Plan or Barriers: CSW assessing proper contacts.  Reason for Continuation of Hospitalization: Delusions  Depression Suicidal ideation  Estimated Length of Stay: 2 days   Attendees: Patient: Steve Davenport 07/25/2016 11:16 AM  Physician: Dr. Kristine Linea, MD  07/25/2016 11:16 AM  Nursing: Leonia Reader, BSN, RN  07/25/2016 11:16 AM  RN Care Manager: 07/25/2016 11:16 AM  Social Worker: Hampton Abbot, MSW, LCSW-A 07/25/2016 11:16 AM  Recreational Therapist: Princella Ion, LRT/CTRS  07/25/2016 11:16 AM  Other:  07/25/2016 11:16 AM  Other:  07/25/2016 11:16 AM  Other: 07/25/2016 11:16 AM    Scribe for Treatment Team: Lynden Oxford, LCSWA 07/25/2016 11:16 AM

## 2016-07-25 NOTE — [facility] Suicide Risk Assessment (Signed)
[facility] INPATIENT:  Family/Significant Other Suicide Prevention Education  Suicide Prevention Education:  Patient Refusal for Family/Significant Other Suicide Prevention Education: The patient Steve Davenport has refused to provide written consent for family/significant other to be provided Family/Significant Other Suicide Prevention Education during admission and/or prior to discharge.  Physician notified.  Lynden OxfordKadijah R Torrey Ballinas, MSW, LCSW-A 07/25/2016, 12:04 PM

## 2016-07-25 NOTE — Discharge Summary (Signed)
Physician Discharge Summary Note  Patient:  Steve Davenport is an 41 y.o., male MRN:  [id number] DOB:  Feb 25, 1976 Patient phone:  [phone] (home)  Patient address:   [address] [facility] [place] [address],  Total Time spent with patient: 30 minutes  Date of Admission:  07/22/2016 Date of Discharge: 07/28/2016  Reason for Admission:  Suicidal ideation.  Identifying data. Steve Davenport is a 41 year old male with a history of depression, mood instability, and substance abuse.  Chief complaint. "I have PTSD, ADHD, and bipolar. I have many medical problems."  History of present illness. Information was obtained from the patient the patient lives at [facility] emergency room initially complaining of severe abdominal pain that he felt could have been associated with kidney stones as he had bloody urine. CT scan of abdomen ruled out any abnormalities. The patient then disclosed that he has a history of bipolar disorder and has been severely depressed, anxious, and suicidal for the past year. He complains of poor sleep, decreased appetite with 15 pound weight loss, anhedonia, feeling of guilt hopelessness and worthlessness, poor energy and concentration, social isolation, and crying spells. He started thinking of suicide by injecting air or delaying onto his veins. His anxiety has been out of control and he reports constant panic attacks, social anxiety, PTSD with nightmares and flashbacks, and OCD with constant cleaning and organizing. He also started experiencing auditory hallucinations and belief that he has snakes in his stomach that he wants to cut out. The patient reports long history of substance abuse but most recently his been abusing cocaine, opioids including Suboxone and benzodiazepines.  Past psychiatric history. The patient reports multiple psychiatric admissions in childhood and adolescence mostly to [facility]. He had several psychiatric admissions in adulthood including for suicide  attempts with hanging. He has been tried on multiple medications but only remembers being on lithium and Depakote. Most recently while living in [facility] he was a patient of Dr. Janeece Riggers at [facility] were he was treated with Seroquel for mood stabilization and Minipress for PTSD symptoms. Once he relocated from [facility] to [place], he lost access to mental health services or medications.   Family psychiatric history. Father and aunt with bipolar. Two cousins ages 19 and 58 committed suicide.  Social history. He is originally from [place] but has been living in [place] for years. He lost housing when his father died recently. Applied for disability 4 months ago for physical (HepC, coronary artery disease, hand injury) as well as mental illness. He is homeless in [place] and uninsured.  Principal Problem: Bipolar I disorder, current or most recent episode depressed, with psychotic features Steve Davenport) Discharge Diagnoses: Patient Active Problem List   Diagnosis Date Noted  . Bipolar I disorder, current or most recent episode depressed, with psychotic features (HCC) [F31.5] 07/22/2016  . Opioid withdrawal (HCC) [F11.23] 07/22/2016  . Opioid use disorder, severe, dependence (HCC) [F11.20] 07/22/2016  . Tobacco use disorder [F17.200] 07/22/2016  . Sedative, hypnotic or anxiolytic use disorder, severe, dependence (HCC) [F13.20] 07/22/2016   Past Medical History:  Past Medical History:  Diagnosis Date  . Anxiety   . Bipolar disorder (HCC)   . Depression   . Kidney stone   . Mental health disorder   . Polysubstance abuse    History reviewed. No pertinent surgical history. Family History: History reviewed. No pertinent family history.   Social History:  History  Alcohol Use No     History  Drug Use No    Social History  Social History  . Marital status: Single    Spouse name: N/A  . Number of children: N/A  . Years of education: N/A   Social History Main Topics  . Smoking status:  Current Every Day Smoker    Packs/day: 0.50    Types: Cigarettes  . Smokeless tobacco: Never Used  . Alcohol use No  . Drug use: No  . Sexual activity: Not Asked   Other Topics Concern  . None   Social History Narrative  . None    Hospital Course:    [name] is a 41 year old male with a history of depression, anxiety, mood instability, and substance abuse admitted for worsening of depression and suicidal ideation in the context of major loss, severe social stressors, and substance abuse.  1. Suicidal ideation. Resolved. The patient adamantly denies any thoughts, intention or plans to hurt himself or others. He is able to contract for safety in the hospital. He is forward thinking and optimistic about the future.  2. Mood and psychosis. We started  Prozac for depression and anxiety and Seroquel for mood stabilization.   3. Opiate dependence. We offered symptomatic treatment.  4. Smoking. Nicotine patch was available.  5. Insomnia. Trazodone was available.  6. Anxiety. We restarted Minipress for nightmares and flashbacks of PTSD and low dose Seroquel for social anxiety.    7. Substance abuse treatment. The patient desires residential treatment. He was accepted at [facility] program.   8. Metabolic syndrome monitoring. Lipid panel and TSH are normal.   9. EKG. Normal sinus rhythm. QTC 410.   10. Upper respiratory infection. He completed treatment with Z-Pak.   11. Kidney stones. CT scan of the abdomen performed in the emergency room revealed no abnormalities. Urine culture was negative.  12. Disposition. He will be discharged to [facility] program on 07/28/2016 if bed available.  Physical Findings: AIMS:  , ,  ,  ,    CIWA:    COWS:     Musculoskeletal: Strength & Muscle Tone: within normal limits Gait & Station: normal Patient leans: N/A  Psychiatric Specialty Exam: Physical Exam  Nursing note and vitals reviewed. Psychiatric: He has a normal mood and  affect. His speech is normal and behavior is normal. Thought content normal. Cognition and memory are normal. He expresses impulsivity.    Review of Systems  Psychiatric/Behavioral: Positive for substance abuse.  All other systems reviewed and are negative.   Blood pressure 117/76, pulse (!) 105, temperature 97.7 F (36.5 C), temperature source Oral, resp. rate 18, height 5\' 9"  (1.753 m), weight 69.4 kg (153 lb), SpO2 99 %.Body mass index is 22.59 kg/m.  General Appearance: Casual  Eye Contact:  Good  Speech:  Clear and Coherent  Volume:  Normal  Mood:  Euthymic  Affect:  Appropriate  Thought Process:  Goal Directed and Descriptions of Associations: Intact  Orientation:  Full (Time, Place, and Person)  Thought Content:  WDL  Suicidal Thoughts:  No  Homicidal Thoughts:  No  Memory:  Immediate;   Fair Recent;   Fair Remote;   Fair  Judgement:  Fair  Insight:  Fair  Psychomotor Activity:  Normal  Concentration:  Concentration: Fair and Attention Span: Fair  Recall:  [employer] of Knowledge:  Fair  Language:  Fair  Akathisia:  No  Handed:  Right  AIMS (if indicated):     Assets:  Communication Skills Desire for Improvement Physical Health Resilience Social Support  ADL's:  Intact  Cognition:  WNL  Sleep:  Number of Hours: 7     Have you used any form of tobacco in the last 30 days? (Cigarettes, Smokeless Tobacco, Cigars, and/or Pipes): Yes  Has this patient used any form of tobacco in the last 30 days? (Cigarettes, Smokeless Tobacco, Cigars, and/or Pipes) Yes, Yes, A prescription for an FDA-approved tobacco cessation medication was offered at discharge and the patient refused  Blood Alcohol level:  Lab Results  Component Value Date   ETH <5 07/20/2016    Metabolic Disorder Labs:  No results found for: HGBA1C, MPG No results found for: PROLACTIN Lab Results  Component Value Date   CHOL 164 07/25/2016   TRIG 127 07/25/2016   HDL 54 07/25/2016   CHOLHDL 3.0  07/25/2016   VLDL 25 07/25/2016   LDLCALC 85 07/25/2016    See Psychiatric Specialty Exam and Suicide Risk Assessment completed by Attending Physician prior to discharge.  Discharge destination:  Other:  [facility] residential  Is patient on multiple antipsychotic therapies at discharge:  No   Has Patient had three or more failed trials of antipsychotic monotherapy by history:  No  Recommended Plan for Multiple Antipsychotic Therapies: NA   Allergies as of 07/25/2016      Reactions   Tilapia [fish Allergy] Shortness Of Breath   Swelling of throat   Tramadol       Medication List    STOP taking these medications   ranitidine 150 MG tablet Commonly known as:  ZANTAC     TAKE these medications     Indication  FLUoxetine 20 MG capsule Commonly known as:  PROZAC Take 1 capsule (20 mg total) by mouth at bedtime.  Indication:  Depression   prazosin 2 MG capsule Commonly known as:  MINIPRESS Take 1 capsule (2 mg total) by mouth 2 (two) times daily.  Indication:  PTSD   QUEtiapine 25 MG tablet Commonly known as:  SEROQUEL Take 1 tablet (25 mg total) by mouth 3 (three) times daily.  Indication:  Depressive Phase of Manic-Depression   QUEtiapine 300 MG tablet Commonly known as:  SEROQUEL Take 1 tablet (300 mg total) by mouth at bedtime.  Indication:  Depressive Phase of Manic-Depression      Follow-up Information    [facility]. Go on 07/28/2016.   Why:  [facility] treatment on March 19th at [facility]. It is important that you arrive on time and have your medications with you. If you have questions, contact [facility] directly. Contact information: Address: [address] , [place] [address] Phone: [phone] Fax: [phone]          Follow-up recommendations:  Activity:  as tolerated. Diet:  low sodiumheart healthy. Other:  keep follow up appointments.  Comments:    Signed: Kristine LineaJolanta Pucilowska, MD 07/25/2016, 10:09 PM
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
## 2016-07-25 NOTE — Progress Notes (Signed)
Denies SI/HI/AVH.  Rates depression as a 4/10.  Verbalizes that he is a little anxious about going [facility].  Medication and group compliant.  No inappropriate behavior.  Support and encouragement offered.  Safety maintained.

## 2016-07-26 LAB — HEMOGLOBIN A1C
Hgb A1c MFr Bld: 5.5 % (ref 4.8–5.6)
Mean Plasma Glucose: 111 mg/dL

## 2016-07-26 NOTE — Progress Notes (Signed)
Pt awake, alert, oriented and up on unit today. Isolates to room some, but does come out for evening group and meals. Pleasant, calm and cooperative. Denies SI/HI/AVH. Complains of feeling sick/tired and of indigestion/heartburn. PRN medications given as ordered for indigestion, reports relief. Reports improvement and voices readiness to [facility] once discharged. Support and encouragement provided. Medications administered as ordered with education. Safety maintained with every 15 minute checks. Will continue to monitor.

## 2016-07-26 NOTE — Progress Notes (Signed)
[facility] MD Progress Note  07/26/2016 1:27 PM Steve Davenport  MRN:  [id number]  Subjective:   Steve Davenport is a 41 year old male with a history of bipolar illness and substance abuse admitted for suicidal ideation, paranoia, auditory hallucinations in the context of medication noncompliance and withdrawal from Suboxone.  07/24/2016. Steve Davenport reports little improvement today. He could not fall asleep until 3:00 in the morning and is a little groggy. He still feels depressed, very anxious, and suicidal. He complains of upper respiratory infection symptoms. He is a rather somatic but does not report symptoms suggestive of opiate withdrawal.. He came to the emergency room initially with complaints of abdominal pain and hematuria suggestive of kidney stones. He accepts medications and tolerates them well. He does participate in programming.  07/25/2016. Steve Davenport reports some improvement but his sleep is still interrupted. His mood is better, affect is brighter. He does not appear severely anxious. He has not been coughing anymore. His appetite is adequate. He accepts medications and tolerates them well. He attends groups. He will be transferred to [facility] substance abuse treatment program on Monday.  Follow-up for Sunday the 17th. Patient says he is feeling significantly better. He denies any current suicidal ideation. He says he is feeling calm. He is tolerating medicine without difficulty. He is looking forward to going to a substance abuse treatment program on Monday.  Per nursing: Goal: Ability to remain free from injury will improve Outcome: Not Met (add Reason) Patient remains anxious. Flat affect. Attends group. Interacting with peers and staff. Med compliant. No PRNs given. ABT for URI. Non prod cough noted. No behavior issues noted. No voiced thoughts of hurting himself. Safety maintained with q 15 min checks.   Principal Problem: Bipolar I disorder, current or most recent episode depressed, with  psychotic features [facility]) Diagnosis:   Patient Active Problem List   Diagnosis Date Noted  . Bipolar I disorder, current or most recent episode depressed, with psychotic features ([place]) [F31.5] 07/22/2016  . Opioid withdrawal ([place]) [F11.23] 07/22/2016  . Opioid use disorder, severe, dependence ([place]) [F11.20] 07/22/2016  . Tobacco use disorder [F17.200] 07/22/2016  . Sedative, hypnotic or anxiolytic use disorder, severe, dependence ([place]) [F13.20] 07/22/2016   Total Time spent with patient: 20 minutes  Past Psychiatric History: bipolar disorder, substance abuse.  Past Medical History:  Past Medical History:  Diagnosis Date  . Anxiety   . Bipolar disorder ([place])   . Depression   . Kidney stone   . Mental health disorder   . Polysubstance abuse    History reviewed. No pertinent surgical history. Family History: History reviewed. No pertinent family history. Family Psychiatric  History: bipolar disorder, two completed suicides. Social History:  History  Alcohol Use No     History  Drug Use No    Social History   Social History  . Marital status: Single    Spouse name: N/A  . Number of children: N/A  . Years of education: N/A   Social History Main Topics  . Smoking status: Current Every Day Smoker    Packs/day: 0.50    Types: Cigarettes  . Smokeless tobacco: Never Used  . Alcohol use No  . Drug use: No  . Sexual activity: Not Asked   Other Topics Concern  . None   Social History Narrative  . None   Additional Social History:    History of alcohol / drug use?: Yes Negative Consequences of Use: Financial, Personal relationships, Work / [employer]  Sleep: Poor  Appetite:  Fair  Current Medications: Current Facility-Administered Medications  Medication Dose Route Frequency Provider Last Rate Last Dose  . acetaminophen (TYLENOL) tablet 1,000 mg  1,000 mg Oral Q6H PRN Hildred Priest, MD      . alum & mag hydroxide-simeth  (MAALOX/MYLANTA) 200-200-20 MG/5ML suspension 30 mL  30 mL Oral Q4H PRN Hildred Priest, MD   30 mL at 07/26/16 0855  . azithromycin (ZITHROMAX) tablet 250 mg  250 mg Oral Daily Clovis Fredrickson, MD   250 mg at 07/26/16 0855  . benzonatate (TESSALON) capsule 200 mg  200 mg Oral TID Clovis Fredrickson, MD   200 mg at 07/26/16 1155  . cyclobenzaprine (FLEXERIL) tablet 5 mg  5 mg Oral TID PRN Hildred Priest, MD      . FLUoxetine (PROZAC) capsule 20 mg  20 mg Oral QHS Jolanta B Pucilowska, MD   20 mg at 07/25/16 2312  . hydrOXYzine (ATARAX/VISTARIL) tablet 50 mg  50 mg Oral TID PRN Hildred Priest, MD   50 mg at 07/26/16 0855  . ibuprofen (ADVIL,MOTRIN) tablet 600 mg  600 mg Oral Q6H PRN Hildred Priest, MD   600 mg at 07/22/16 2000  . loperamide (IMODIUM) capsule 4 mg  4 mg Oral PRN Hildred Priest, MD      . magnesium hydroxide (MILK OF MAGNESIA) suspension 30 mL  30 mL Oral Daily PRN Hildred Priest, MD      . nicotine (NICODERM CQ - dosed in mg/24 hours) patch 21 mg  21 mg Transdermal Daily Hildred Priest, MD   21 mg at 07/26/16 0858  . ondansetron (ZOFRAN-ODT) disintegrating tablet 8 mg  8 mg Oral Q8H PRN Hildred Priest, MD   8 mg at 07/26/16 1156  . prazosin (MINIPRESS) capsule 2 mg  2 mg Oral BID Clovis Fredrickson, MD   2 mg at 07/26/16 0855  . QUEtiapine (SEROQUEL) tablet 25 mg  25 mg Oral TID Clovis Fredrickson, MD   25 mg at 07/26/16 1155  . QUEtiapine (SEROQUEL) tablet 300 mg  300 mg Oral QHS Jolanta B Pucilowska, MD   300 mg at 07/25/16 2310  . traZODone (DESYREL) tablet 150 mg  150 mg Oral QHS PRN Hildred Priest, MD   150 mg at 07/25/16 2312  . tuberculin injection 5 Units  5 Units Intradermal Once Clovis Fredrickson, MD   5 Units at 07/24/16 1359    Lab Results:  Results for orders placed or performed during the hospital encounter of 07/22/16 (from the past 48 hour(s))  Lipid panel      Status: None   Collection Time: 07/25/16  6:40 AM  Result Value Ref Range   Cholesterol 164 0 - 200 mg/dL   Triglycerides 127 <150 mg/dL   HDL 54 >40 mg/dL   Total CHOL/HDL Ratio 3.0 RATIO   VLDL 25 0 - 40 mg/dL   LDL Cholesterol 85 0 - 99 mg/dL    Comment:        Total Cholesterol/HDL:CHD Risk Coronary Heart Disease Risk Table                     Men   Women  1/2 Average Risk   3.4   3.3  Average Risk       5.0   4.4  2 X Average Risk   9.6   7.1  3 X Average Risk  23.4   11.0        Use the  calculated Patient Ratio above and the CHD Risk Table to determine the patient's CHD Risk.        ATP III CLASSIFICATION (LDL):  <100     mg/dL   Optimal  100-129  mg/dL   Near or Above                    Optimal  130-159  mg/dL   Borderline  160-189  mg/dL   High  >190     mg/dL   Very High   TSH     Status: None   Collection Time: 07/25/16  6:40 AM  Result Value Ref Range   TSH 2.022 0.350 - 4.500 uIU/mL    Comment: Performed by a 3rd Generation assay with a functional sensitivity of <=0.01 uIU/mL.  Hemoglobin A1c     Status: None   Collection Time: 07/25/16  6:40 AM  Result Value Ref Range   Hgb A1c MFr Bld 5.5 4.8 - 5.6 %    Comment: (NOTE)         Pre-diabetes: 5.7 - 6.4         Diabetes: >6.4         Glycemic control for adults with diabetes: <7.0    Mean Plasma Glucose 111 mg/dL    Comment: (NOTE) Performed At: [facility] [place], [place] [id number] Lindon Romp MD [id number]     Blood Alcohol level:  Lab Results  Component Value Date   [facility] <5 81/44/8185    Metabolic Disorder Labs: Lab Results  Component Value Date   HGBA1C 5.5 07/25/2016   MPG 111 07/25/2016   No results found for: PROLACTIN Lab Results  Component Value Date   CHOL 164 07/25/2016   TRIG 127 07/25/2016   HDL 54 07/25/2016   CHOLHDL 3.0 07/25/2016   VLDL 25 07/25/2016   LDLCALC 85 07/25/2016    Physical Findings: AIMS:  , ,  ,  ,    CIWA:     COWS:     Musculoskeletal: Strength & Muscle Tone: within normal limits Gait & Station: normal Patient leans: N/A  Psychiatric Specialty Exam: Physical Exam  Nursing note and vitals reviewed. Constitutional: He appears well-developed and well-nourished.  HENT:  Head: Normocephalic and atraumatic.  Eyes: Conjunctivae are normal. Pupils are equal, round, and reactive to light.  Neck: Normal range of motion.  Cardiovascular: Normal heart sounds.   Respiratory: Effort normal.  GI: Soft.  Musculoskeletal: Normal range of motion.  Neurological: He is alert.  Skin: Skin is warm and dry.  Psychiatric: His mood appears not anxious. He is actively hallucinating. He is not hyperactive. Cognition and memory are normal. He expresses impulsivity. He exhibits a depressed mood. He expresses no suicidal ideation. He expresses no suicidal plans.    Review of Systems  Respiratory: Positive for cough.   Genitourinary: Positive for hematuria.  Psychiatric/Behavioral: Positive for depression, hallucinations, substance abuse and suicidal ideas.  All other systems reviewed and are negative.   Blood pressure 115/70, pulse (!) 102, temperature 98.1 F (36.7 C), temperature source Oral, resp. rate 18, height _0  (1.753 m), weight 69.4 kg (153 lb), SpO2 99 %.Body mass index is 22.59 kg/m.  General Appearance: Casual  Eye Contact:  Good  Speech:  Pressured  Volume:  Normal  Mood:  Euthymic  Affect:  Appropriate  Thought Process:  Goal Directed and Descriptions of Associations: Intact  Orientation:  Full (Time, Place, and Person)  Thought Content:  Logical  Suicidal Thoughts:  No  Homicidal Thoughts:  No  Memory:  Immediate;   Fair Recent;   Fair Remote;   Fair  Judgement:  Poor  Insight:  Lacking  Psychomotor Activity:  Restlessness  Concentration:  Concentration: Fair and Attention Span: Fair  Recall:  [employer] of Knowledge:  Fair  Language:  Fair  Akathisia:  No  Handed:  Right   AIMS (if indicated):     Assets:  Communication Skills Desire for Improvement Resilience  ADL's:  Intact  Cognition:  WNL  Sleep:  Number of Hours: 7     Treatment Plan Summary: Daily contact with patient to assess and evaluate symptoms and progress in treatment and Medication management   Steve Davenport is a 41 year old male with a history of depression, anxiety, mood instability, and substance abuse admitted for worsening of depression and suicidal ideation in the context of major loss, severe social stressors, and substance abuse.  1. Suicidal ideation. The patient is able to contract for safety in the hospital.  2. Mood and psychosis. We started Luvox for depression and anxiety but switched it to Prozac due to cost. The patient is uninsured. We added Seroquel for psychosis.  3. Opiate dependence. The patient claims to be withdrawing from Suboxone. We will offer symptomatic treatment.  4. Smoking. Nicotine patch is available.  5. Insomnia. Trazodone is available.  6. PTSD. We increased Minipress to 2 mg twice daily.   7. Substance abuse treatment. The patient desires residential treatment. He was accepted at [facility]. PPD placed.  8. Metabolic syndrome monitoring. Lipid panel and TSH are normal, hemoglobin A1c are pending.   9. EKG. Normal sinus rhythm. QTC 410.   10. Upper respiratory infection. He was started on a Z-Pak and Tessalon Perles.   11. Hematuria. CT scan of the abdomen performed in the emergency room revealed no abnormalities. Urine culture was negative.  12. Disposition. He will be discharged to [facility] program.  As of Sunday he appears to be doing much better. Calm. Cooperative. No new complaints. Not agitated. No active suicidal ideation. No change to medication anticipate likely follow through with plan to go to substance abuse treatment on Monday.  Alethia Berthold, MD 07/26/2016, 1:27 PM

## 2016-07-26 NOTE — Progress Notes (Signed)
   07/26/16 1042  PPD Results  Does patient have an induration at the injection site? No  Induration(mm) 0 mm

## 2016-07-26 NOTE — [facility] Group Notes (Signed)
[facility] LCSW Group Therapy  07/26/2016 3:00 PM  Type of Therapy:  Group Therapy  Participation Level:  Minimal  Participation Quality:  Attentive  Affect:  Appropriate  Cognitive:  Alert  Insight:  Improving  Engagement in Therapy:  Improving  Modes of Intervention:  Discussion, Education, Reality Testing, Socialization and Support  Summary of Progress/Problems: Goal Setting: The objective is to set goals as they relate to the crisis in which they were admitted. Patients will be using SMART goal modalities to set measurable goals. Characteristics of realistic goals will be discussed and patients will be assisted in setting and processing how one will reach their goal. Facilitator will also assist patients in applying interventions and coping skills learned in psycho-education groups to the SMART goal and process how one will achieve defined goal.   Steve Davenport G. Garnette CzechSampson MSW, LCSWA 07/26/2016, 3:01 PM

## 2016-07-26 NOTE — Plan of Care (Signed)
Problem: Coping: Goal: Ability to verbalize feelings will improve Outcome: Progressing Patient verbalized feelings to staff.    

## 2016-07-26 NOTE — Progress Notes (Signed)
D: Pt denies SI/HI/AVH. Pt is pleasant and cooperative, affect is flat and sad. Pt stated he feels much better from resting. Patient's thoughts are organized, no bizarre behavior noted. Patient appears less anxious and she is interacting with peers and staff appropriately.  A: Pt was offered support and encouragement. Pt was given scheduled medications. Pt was encouraged to attend groups. Q 15 minute checks were done for safety.  R:Pt attends groups and interacts well with peers and staff. Pt is taking medication. Pt has no complaints.Pt receptive to treatment and safety maintained on unit.

## 2016-07-26 NOTE — Plan of Care (Signed)
Problem: Education: Goal: Will be free of psychotic symptoms Outcome: Progressing Denies SI/HI/AVH, does not appear to be responding to internal stimuli. Pleasant and cooperative.

## 2016-07-27 NOTE — Progress Notes (Signed)
[facility] [place] MD Progress Note  07/27/2016 1:56 PM Viraat Vanpatten  MRN:  [id number]  Subjective:   Mr. Rudzinski is a 41 year old male with a history of bipolar illness and substance abuse admitted for suicidal ideation, paranoia, auditory hallucinations in the context of medication noncompliance and withdrawal from Suboxone.  07/24/2016. Mr. Dimmick reports little improvement today. He could not fall asleep until 3:00 in the morning and is a little groggy. He still feels depressed, very anxious, and suicidal. He complains of upper respiratory infection symptoms. He is a rather somatic but does not report symptoms suggestive of opiate withdrawal.. He came to the emergency room initially with complaints of abdominal pain and hematuria suggestive of kidney stones. He accepts medications and tolerates them well. He does participate in programming.  07/25/2016. Mr. Oehlert reports some improvement but his sleep is still interrupted. His mood is better, affect is brighter. He does not appear severely anxious. He has not been coughing anymore. His appetite is adequate. He accepts medications and tolerates them well. He attends groups. He will be transferred to [facility] substance abuse treatment program on Monday.  Follow-up Sunday the 18th. Patient seen. Chart reviewed. Patient is having episodes of tachycardia with a little bit of dizziness today. He thinks it is all from his nerves. Otherwise mood is calm. No suicidal ideation and no psychosis. Generally looking forward to discharge tomorrow..  Per nursing: Goal: Ability to remain free from injury will improve Outcome: Not Met (add Reason) Patient remains anxious. Flat affect. Attends group. Interacting with peers and staff. Med compliant. No PRNs given. ABT for URI. Non prod cough noted. No behavior issues noted. No voiced thoughts of hurting himself. Safety maintained with q 15 min checks.   Principal Problem: Bipolar I disorder, current or most recent episode depressed,  with psychotic features [facility]) Diagnosis:   Patient Active Problem List   Diagnosis Date Noted  . Bipolar I disorder, current or most recent episode depressed, with psychotic features ([place]) [F31.5] 07/22/2016  . Opioid withdrawal ([place]) [F11.23] 07/22/2016  . Opioid use disorder, severe, dependence ([place]) [F11.20] 07/22/2016  . Tobacco use disorder [F17.200] 07/22/2016  . Sedative, hypnotic or anxiolytic use disorder, severe, dependence ([place]) [F13.20] 07/22/2016   Total Time spent with patient: 20 minutes  Past Psychiatric History: bipolar disorder, substance abuse.  Past Medical History:  Past Medical History:  Diagnosis Date  . Anxiety   . Bipolar disorder ([place])   . Depression   . Kidney stone   . Mental health disorder   . Polysubstance abuse    History reviewed. No pertinent surgical history. Family History: History reviewed. No pertinent family history. Family Psychiatric  History: bipolar disorder, two completed suicides. Social History:  History  Alcohol Use No     History  Drug Use No    Social History   Social History  . Marital status: Single    Spouse name: N/A  . Number of children: N/A  . Years of education: N/A   Social History Main Topics  . Smoking status: Current Every Day Smoker    Packs/day: 0.50    Types: Cigarettes  . Smokeless tobacco: Never Used  . Alcohol use No  . Drug use: No  . Sexual activity: Not Asked   Other Topics Concern  . None   Social History Narrative  . None   Additional Social History:    History of alcohol / drug use?: Yes Negative Consequences of Use: Financial, Personal relationships, Work / [employer]  Sleep: Poor  Appetite:  Fair  Current Medications: Current Facility-Administered Medications  Medication Dose Route Frequency Provider Last Rate Last Dose  . acetaminophen (TYLENOL) tablet 1,000 mg  1,000 mg Oral Q6H PRN Hildred Priest, MD      . alum & mag hydroxide-simeth  (MAALOX/MYLANTA) 200-200-20 MG/5ML suspension 30 mL  30 mL Oral Q4H PRN Hildred Priest, MD   30 mL at 07/26/16 2142  . benzonatate (TESSALON) capsule 200 mg  200 mg Oral TID Clovis Fredrickson, MD   200 mg at 07/27/16 1135  . cyclobenzaprine (FLEXERIL) tablet 5 mg  5 mg Oral TID PRN Hildred Priest, MD      . FLUoxetine (PROZAC) capsule 20 mg  20 mg Oral QHS Clovis Fredrickson, MD   20 mg at 07/26/16 2142  . hydrOXYzine (ATARAX/VISTARIL) tablet 50 mg  50 mg Oral TID PRN Hildred Priest, MD   50 mg at 07/26/16 0855  . ibuprofen (ADVIL,MOTRIN) tablet 600 mg  600 mg Oral Q6H PRN Hildred Priest, MD   600 mg at 07/22/16 2000  . loperamide (IMODIUM) capsule 4 mg  4 mg Oral PRN Hildred Priest, MD      . magnesium hydroxide (MILK OF MAGNESIA) suspension 30 mL  30 mL Oral Daily PRN Hildred Priest, MD      . nicotine (NICODERM CQ - dosed in mg/24 hours) patch 21 mg  21 mg Transdermal Daily Hildred Priest, MD   21 mg at 07/27/16 0813  . ondansetron (ZOFRAN-ODT) disintegrating tablet 8 mg  8 mg Oral Q8H PRN Hildred Priest, MD   8 mg at 07/26/16 1156  . prazosin (MINIPRESS) capsule 2 mg  2 mg Oral BID Clovis Fredrickson, MD   2 mg at 07/27/16 0811  . QUEtiapine (SEROQUEL) tablet 25 mg  25 mg Oral TID Clovis Fredrickson, MD   25 mg at 07/27/16 1135  . QUEtiapine (SEROQUEL) tablet 300 mg  300 mg Oral QHS Jolanta B Pucilowska, MD   300 mg at 07/26/16 2142  . traZODone (DESYREL) tablet 150 mg  150 mg Oral QHS PRN Hildred Priest, MD   150 mg at 07/26/16 2142    Lab Results:  No results found for this or any previous visit (from the past 48 hour(s)).  Blood Alcohol level:  Lab Results  Component Value Date   ETH <5 11/91/4782    Metabolic Disorder Labs: Lab Results  Component Value Date   HGBA1C 5.5 07/25/2016   MPG 111 07/25/2016   No results found for: PROLACTIN Lab Results  Component Value Date    CHOL 164 07/25/2016   TRIG 127 07/25/2016   HDL 54 07/25/2016   CHOLHDL 3.0 07/25/2016   VLDL 25 07/25/2016   LDLCALC 85 07/25/2016    Physical Findings: AIMS:  , ,  ,  ,    CIWA:    COWS:     Musculoskeletal: Strength & Muscle Tone: within normal limits Gait & Station: normal Patient leans: N/A  Psychiatric Specialty Exam: Physical Exam  Nursing note and vitals reviewed. Constitutional: He appears well-developed and well-nourished.  HENT:  Head: Normocephalic and atraumatic.  Eyes: Conjunctivae are normal. Pupils are equal, round, and reactive to light.  Neck: Normal range of motion.  Cardiovascular: Normal heart sounds.  Tachycardia present.   Respiratory: Effort normal.  GI: Soft.  Musculoskeletal: Normal range of motion.  Neurological: He is alert.  Skin: Skin is warm and dry.  Psychiatric: His mood appears not anxious. He is not hyperactive  and not actively hallucinating. Cognition and memory are normal. He does not express impulsivity. He does not exhibit a depressed mood. He expresses no suicidal ideation. He expresses no suicidal plans.    Review of Systems  Respiratory: Positive for cough.   Genitourinary: Positive for hematuria.  Psychiatric/Behavioral: Positive for depression, hallucinations, substance abuse and suicidal ideas.  All other systems reviewed and are negative.   Blood pressure 110/63, pulse 99, temperature 97.8 F (36.6 C), temperature source Oral, resp. rate 18, height '5\' 9"'  (1.753 m), weight 69.4 kg (153 lb), SpO2 99 %.Body mass index is 22.59 kg/m.  General Appearance: Casual  Eye Contact:  Good  Speech:  Pressured  Volume:  Normal  Mood:  Euthymic  Affect:  Appropriate  Thought Process:  Goal Directed and Descriptions of Associations: Intact  Orientation:  Full (Time, Place, and Person)  Thought Content:  Logical  Suicidal Thoughts:  No  Homicidal Thoughts:  No  Memory:  Immediate;   Fair Recent;   Fair Remote;   Fair  Judgement:   Poor  Insight:  Lacking  Psychomotor Activity:  Restlessness  Concentration:  Concentration: Fair and Attention Span: Fair  Recall:  [employer] of Knowledge:  Fair  Language:  Fair  Akathisia:  No  Handed:  Right  AIMS (if indicated):     Assets:  Communication Skills Desire for Improvement Resilience  ADL's:  Intact  Cognition:  WNL  Sleep:  Number of Hours: 8     Treatment Plan Summary: Daily contact with patient to assess and evaluate symptoms and progress in treatment and Medication management   Mr. Mauch is a 41 year old male with a history of depression, anxiety, mood instability, and substance abuse admitted for worsening of depression and suicidal ideation in the context of major loss, severe social stressors, and substance abuse.  1. Suicidal ideation. The patient is able to contract for safety in the hospital.  2. Mood and psychosis. We started Luvox for depression and anxiety but switched it to Prozac due to cost. The patient is uninsured. We added Seroquel for psychosis.  3. Opiate dependence. The patient claims to be withdrawing from Suboxone. We will offer symptomatic treatment.  4. Smoking. Nicotine patch is available.  5. Insomnia. Trazodone is available.  6. PTSD. We increased Minipress to 2 mg twice daily.   7. Substance abuse treatment. The patient desires residential treatment. He was accepted at [facility]. PPD placed.  8. Metabolic syndrome monitoring. Lipid panel and TSH are normal, hemoglobin A1c are pending.   9. EKG. Normal sinus rhythm. QTC 410.   10. Upper respiratory infection. He was started on a Z-Pak and Tessalon Perles.   11. Hematuria. CT scan of the abdomen performed in the emergency room revealed no abnormalities. Urine culture was negative.  12. Disposition. He will be discharged to [facility] program.  I looked through his medicine. The Seroquel could be causing some tachycardia but it could just be from anxiety and  dehydration. Encouraged patient to stay well hydrated. No change to treatment today. Continue plan for likely discharge tomorrow.  Alethia Berthold, MD 07/27/2016, 1:56 PM

## 2016-07-27 NOTE — Plan of Care (Signed)
Problem: Health Behavior/Discharge Planning: Goal: Ability to manage health-related needs will improve Outcome: Progressing Verbalized need for continuation of care and compliance with treatments.

## 2016-07-27 NOTE — [facility] Group Notes (Signed)
[facility] LCSW Group Therapy  07/27/2016 3:13 PM  Type of Therapy:  Group Therapy  Participation Level:  Minimal  Participation Quality:  Attentive  Affect:  Appropriate  Cognitive:  Alert  Insight:  Developing/Improving  Engagement in Therapy:  Improving  Modes of Intervention:  Activity, Discussion, Education, Problem-solving, Reality Testing, Socialization and Support  Summary of Progress/Problems: Stress management: Patients defined and discussed the topic of stress and the related symptoms and triggers for stress. Patients identified healthy coping skills they would like to try during hospitalization and after discharge to manage stress in a healthy way. CSW offered insight to varying stress management techniques.   Steve Davenport MSW, LCSWA 07/27/2016, 3:13 PM

## 2016-07-27 NOTE — Plan of Care (Signed)
Problem: Education: Goal: Will be free of psychotic symptoms Outcome: Progressing No s/s of psychosis.

## 2016-07-27 NOTE — [facility] Group Notes (Signed)
[facility] Group Notes:  (Nursing/MHT/Case Management/Adjunct)  Date:  07/27/2016  Time:  12:44 AM  Type of Therapy:  Group Therapy  Participation Level:  Active  Participation Quality:  Appropriate  Affect:  Appropriate  Cognitive:  Appropriate  Insight:  Appropriate  Engagement in Group:  Engaged  Modes of Intervention:  n/a  Summary of Progress/Problems:  Steve Davenport 07/27/2016, 12:44 AM

## 2016-07-27 NOTE — Progress Notes (Signed)
AAOx4, compliant with medications, calm and pleasant on unit. Endorses some anxiety that is managed by scheduled medications. Eager to discharge tomorrow and continue with care in longer term setting. Denies SI.HI.AVH, verbally contracts for safety. Encouraged to participate but remains in room most of day except for meals and snacks. Will continue to monitor.

## 2016-07-27 NOTE — Progress Notes (Signed)
D: Pt denies SI/HI/AVH. Pt is pleasant and cooperative, affect is flat and sad. Patient thoughts are organized, no bizarre behavior noted. Pt stated he feels much better from been on the unit. Patient appears less anxious and he is interacting with peers and staff appropriately.  A: Pt was offered support and encouragement. Pt was given scheduled medications. Pt was encouraged to attend groups. Q 15 minute checks were done for safety.  R:Pt attends groups and interacts well with peers and staff. Pt is taking medication. Pt has no complaints.Pt receptive to treatment and safety maintained on unit.

## 2016-07-28 NOTE — Progress Notes (Addendum)
Pt denies SI/HI/AVH. Pt given discharge instructions including prescriptions, suicide risk assessment, transition report and f/u appointments. Pt states understanding. Pt states receipt of all belongings.

## 2016-07-28 NOTE — Progress Notes (Signed)
  [facility]  Will you be returning to the same living situation after discharge:  No. At discharge, do you have transportation home?: Yes,  cab voucher. Do you have the ability to pay for your medications: No.  Release of information consent forms completed and in the chart;  Patient's signature needed at discharge.  Patient to Follow up at: Follow-up Information    [facility]. Go on 07/28/2016.   Why:  [facility]. It is important that you arrive on time and have your medications with you. If you have questions, contact [facility] directly. Contact information: Address: [address] [place], [place] [address] Phone: [phone] Fax: [phone]          Next level of care provider has [facility]:no  Safety Planning and Suicide Prevention discussed: Yes,  SPE completed with patient.  Have you used any form of tobacco in the last 30 days? (Cigarettes, Smokeless Tobacco, Cigars, and/or Pipes): Yes  Has patient been referred to the Quitline?: Patient refused referral  Patient has been referred for addiction treatment: Yes  Lynden OxfordKadijah R Shelitha Magley, MSW, LCSW-A 07/28/2016, 9:46 AM

## 2016-07-28 NOTE — Discharge Summary (Signed)
Physician Discharge Summary Note  Patient:  Steve Davenport is an 41 y.o., male MRN:  [id number] DOB:  May 28, 1975 Patient phone:  [phone] (home)  Patient address:   [address] [facility] [place] [address],  Total Time spent with patient: 30 minutes  Date of Admission:  07/22/2016 Date of Discharge: 07/28/2016  Reason for Admission:  Suicidal ideation.  Identifying data. Steve Davenport is a 41 year old male with a history of depression, mood instability, and substance abuse.  Chief complaint. "I have PTSD, ADHD, and bipolar. I have many medical problems."  History of present illness. Information was obtained from the patient the patient lives at [facility] emergency room initially complaining of severe abdominal pain that he felt could have been associated with kidney stones as he had bloody urine. CT scan of abdomen ruled out any abnormalities. The patient then disclosed that he has a history of bipolar disorder and has been severely depressed, anxious, and suicidal for the past year. He complains of poor sleep, decreased appetite with 15 pound weight loss, anhedonia, feeling of guilt hopelessness and worthlessness, poor energy and concentration, social isolation, and crying spells. He started thinking of suicide by injecting air or delaying onto his veins. His anxiety has been out of control and he reports constant panic attacks, social anxiety, PTSD with nightmares and flashbacks, and OCD with constant cleaning and organizing. He also started experiencing auditory hallucinations and belief that he has snakes in his stomach that he wants to cut out. The patient reports long history of substance abuse but most recently his been abusing cocaine, opioids including Suboxone and benzodiazepines.  Past psychiatric history. The patient reports multiple psychiatric admissions in childhood and adolescence mostly to [facility]. He had several psychiatric admissions in adulthood including for suicide  attempts with hanging. He has been tried on multiple medications but only remembers being on lithium and Depakote. Most recently while living in [facility] he was a patient of Dr. Janeece Riggers at [facility] were he was treated with Seroquel for mood stabilization and Minipress for PTSD symptoms. Once he relocated from [facility] to [place], he lost access to mental health services or medications.   Family psychiatric history. Father and aunt with bipolar. Two cousins ages 48 and 69 committed suicide.  Social history. He is originally from [place] but has been living in [place] for years. He lost housing when his father died recently. Applied for disability 4 months ago for physical (HepC, coronary artery disease, hand injury) as well as mental illness. He is homeless in [place] and uninsured.  Principal Problem: Bipolar I disorder, current or most recent episode depressed, with psychotic features [facility]) Discharge Diagnoses: Patient Active Problem List   Diagnosis Date Noted  . Bipolar I disorder, current or most recent episode depressed, with psychotic features (HCC) [F31.5] 07/22/2016  . Opioid withdrawal (HCC) [F11.23] 07/22/2016  . Opioid use disorder, severe, dependence (HCC) [F11.20] 07/22/2016  . Tobacco use disorder [F17.200] 07/22/2016  . Sedative, hypnotic or anxiolytic use disorder, severe, dependence (HCC) [F13.20] 07/22/2016   Past Medical History:  Past Medical History:  Diagnosis Date  . Anxiety   . Bipolar disorder (HCC)   . Depression   . Kidney stone   . Mental health disorder   . Polysubstance abuse    History reviewed. No pertinent surgical history. Family History: History reviewed. No pertinent family history.   Social History:  History  Alcohol Use No     History  Drug Use No    Social History  Social History  . Marital status: Single    Spouse name: N/A  . Number of children: N/A  . Years of education: N/A   Social History Main Topics  . Smoking status:  Current Every Day Smoker    Packs/day: 0.50    Types: Cigarettes  . Smokeless tobacco: Never Used  . Alcohol use No  . Drug use: No  . Sexual activity: Not Asked   Other Topics Concern  . None   Social History Narrative  . None    Hospital Course:    Steve Davenport is a 41 year old male with a history of depression, anxiety, mood instability, and substance abuse admitted for worsening of depression and suicidal ideation in the context of major loss, severe social stressors, and substance abuse.  1. Suicidal ideation. Resolved. The patient adamantly denies any thoughts, intention or plans to hurt himself or others. He is able to contract for safety in the hospital. He is forward thinking and optimistic about the future.  2. Mood and psychosis. We started  Prozac for depression and anxiety and Seroquel for mood stabilization.   3. Opiate dependence. We offered symptomatic treatment.  4. Smoking. Nicotine patch was available.  5. Insomnia. Trazodone was available.  6. Anxiety. We restarted Minipress for nightmares and flashbacks of PTSD and low dose Seroquel for social anxiety.    7. Substance abuse treatment. The patient desires residential treatment. He was accepted at [facility] program.   8. Metabolic syndrome monitoring. Lipid panel and TSH are normal.   9. EKG. Normal sinus rhythm. QTC 410.   10. Upper respiratory infection. He completed treatment with Z-Pak.   11. Kidney stones. CT scan of the abdomen performed in the emergency room revealed no abnormalities. Urine culture was negative.  12. Disposition. He will be discharged to [facility] program on 07/28/2016 if bed available.  Physical Findings: AIMS:  , ,  ,  ,    CIWA:    COWS:     Musculoskeletal: Strength & Muscle Tone: within normal limits Gait & Station: normal Patient leans: N/A  Psychiatric Specialty Exam: Physical Exam  Nursing note and vitals reviewed. Psychiatric: He has a normal mood and  affect. His speech is normal and behavior is normal. Thought content normal. Cognition and memory are normal. He expresses impulsivity.    Review of Systems  Psychiatric/Behavioral: Positive for substance abuse.  All other systems reviewed and are negative.   Blood pressure 112/73, pulse 84, temperature 98 F (36.7 C), temperature source Oral, resp. rate 18, height 5\' 9"  (1.753 m), weight 69.4 kg (153 lb), SpO2 99 %.Body mass index is 22.59 kg/m.  General Appearance: Casual  Eye Contact:  Good  Speech:  Clear and Coherent  Volume:  Normal  Mood:  Euthymic  Affect:  Appropriate  Thought Process:  Goal Directed and Descriptions of Associations: Intact  Orientation:  Full (Time, Place, and Person)  Thought Content:  WDL  Suicidal Thoughts:  No  Homicidal Thoughts:  No  Memory:  Immediate;   Fair Recent;   Fair Remote;   Fair  Judgement:  Fair  [facility]:  Fair  Psychomotor Activity:  Normal  Concentration:  Concentration: Fair and Attention Span: Fair  Recall:  [employer] of Knowledge:  Fair  Language:  Fair  Akathisia:  No  Handed:  Right  AIMS (if indicated):     Assets:  Communication Skills Desire for Improvement Physical Health Resilience Social Support  ADL's:  Intact  Cognition:  WNL  Sleep:  Number of Hours: 8     Have you used any form of tobacco in the last 30 days? (Cigarettes, Smokeless Tobacco, Cigars, and/or Pipes): Yes  Has this patient used any form of tobacco in the last 30 days? (Cigarettes, Smokeless Tobacco, Cigars, and/or Pipes) Yes, Yes, A prescription for an FDA-approved tobacco cessation medication was offered at discharge and the patient refused  Blood Alcohol level:  Lab Results  Component Value Date   [facility]5 07/20/2016    Metabolic Disorder Labs:  Lab Results  Component Value Date   HGBA1C 5.5 07/25/2016   MPG 111 07/25/2016   No results found for: PROLACTIN Lab Results  Component Value Date   CHOL 164 07/25/2016   TRIG 127  07/25/2016   HDL 54 07/25/2016   CHOLHDL 3.0 07/25/2016   VLDL 25 07/25/2016   LDLCALC 85 07/25/2016    See Psychiatric Specialty Exam and Suicide Risk Assessment completed by Attending Physician prior to discharge.  Discharge destination:  Other:  [facility] residential  Is patient on multiple antipsychotic therapies at discharge:  No   Has Patient had three or more failed trials of antipsychotic monotherapy by history:  No  Recommended Plan for Multiple Antipsychotic Therapies: NA  Discharge Instructions    Diet - low sodium heart healthy    Complete by:  As directed    Increase activity slowly    Complete by:  As directed      Allergies as of 07/28/2016      Reactions   Tilapia [fish Allergy] Shortness Of Breath   Swelling of throat   Tramadol       Medication List    STOP taking these medications   ranitidine 150 MG tablet Commonly known as:  ZANTAC     TAKE these medications     Indication  FLUoxetine 20 MG capsule Commonly known as:  PROZAC Take 1 capsule (20 mg total) by mouth at bedtime.  Indication:  Depression   prazosin 2 MG capsule Commonly known as:  MINIPRESS Take 1 capsule (2 mg total) by mouth 2 (two) times daily.  Indication:  PTSD   QUEtiapine 25 MG tablet Commonly known as:  SEROQUEL Take 1 tablet (25 mg total) by mouth 3 (three) times daily.  Indication:  Depressive Phase of Manic-Depression   QUEtiapine 300 MG tablet Commonly known as:  SEROQUEL Take 1 tablet (300 mg total) by mouth at bedtime.  Indication:  Depressive Phase of Manic-Depression      Follow-up Information    [facility]. Go on 07/28/2016.   Why:  [facility] on March 19[facility] at [facility]. It is important that you arrive on time and have your medications with you. If you have questions, contact [facility] directly. Contact information: Address: [address] [place], [place] [address] Phone: [phone] Fax: [phone]           Follow-up recommendations:  Activity:  as tolerated. Diet:  low sodiumheart healthy. Other:  keep follow up appointments.  Comments:    Signed: Kristine LineaJolanta Ishani Goldwasser, MD 07/28/2016, 7:10 AM

## 2016-07-28 NOTE — [facility] Suicide Risk Assessment (Signed)
[facility]   Principal Problem: Bipolar I disorder, current or most recent episode depressed, with psychotic features [facility](HCC) Discharge Diagnoses:  Patient Active Problem List   Diagnosis Date Noted  . Bipolar I disorder, current or most recent episode depressed, with psychotic features (HCC) [F31.5] 07/22/2016  . Opioid withdrawal (HCC) [F11.23] 07/22/2016  . Opioid use disorder, severe, dependence (HCC) [F11.20] 07/22/2016  . Tobacco use disorder [F17.200] 07/22/2016  . Sedative, hypnotic or anxiolytic use disorder, severe, dependence (HCC) [F13.20] 07/22/2016    Total Time spent with patient: 30 minutes  Musculoskeletal: Strength & Muscle Tone: within normal limits Gait & Station: normal Patient leans: N/A  Psychiatric Specialty Exam: Review of Systems  Psychiatric/Behavioral: Positive for substance abuse.  All other systems reviewed and are negative.   Blood pressure 112/73, pulse 84, temperature 98 F (36.7 C), temperature source Oral, resp. rate 18, height 5\' 9"  (1.753 m), weight 69.4 kg (153 lb), SpO2 99 %.Body mass index is 22.59 kg/m.  General Appearance: Casual  Eye Contact::  Good  Speech:  Clear and Coherent409  Volume:  Normal  Mood:  Euthymic  Affect:  Appropriate  Thought Process:  Goal Directed and Descriptions of Associations: Intact  Orientation:  Full (Time, Place, and Person)  Thought Content:  WDL  Suicidal Thoughts:  No  Homicidal Thoughts:  No  Memory:  Immediate;   Fair Recent;   Fair Remote;   Fair  Judgement:  Impaired  Insight:  Present  Psychomotor Activity:  Normal  Concentration:  Fair  Recall:  [employer]Fair  Fund of Knowledge:Fair  Language: Fair  Akathisia:  No  Handed:  Right  AIMS (if indicated):     Assets:  Communication Skills Desire for Improvement Physical Health Resilience Social Support  Sleep:  Number of Hours: 8  Cognition: WNL  ADL's:  Intact   Mental Status Per Nursing Assessment::   On  Admission:     Demographic Factors:  Male, Divorced or widowed, Low socioeconomic status and Unemployed  Loss Factors: Decrease in vocational status and Financial problems/change in socioeconomic status  Historical Factors: Prior suicide attempts, Family history of mental illness or substance abuse and Impulsivity  Risk Reduction Factors:   Sense of responsibility to family  Continued Clinical Symptoms:  Bipolar Disorder:   Depressive phase Depression:   Comorbid alcohol abuse/dependence Impulsivity Alcohol/Substance Abuse/Dependencies  Cognitive Features That Contribute To Risk:  None    Suicide Risk:  Minimal: No identifiable suicidal ideation.  Patients presenting with no risk factors but with morbid ruminations; may be classified as minimal risk based on the severity of the depressive symptoms  Follow-up Information    [facility]. Go on 07/28/2016.   Why:  [facility] March [facility] at [facility]. It is important that you arrive on time and have your medications with you. If you have questions, contact [facility] directly. Contact information: Address: [address] [place], [place] [address] Phone: [phone] Fax: [phone]          Plan Of Care/Follow-up recommendations:  Activity:  as tolerated. Diet:  low sodium heart healthy. Other:  keep follow up appointment.  Kristine LineaJolanta Summer Mccolgan, MD 07/28/2016, 7:09 AM

## 2016-07-28 NOTE — Progress Notes (Signed)
Recreation Therapy Notes  INPATIENT RECREATION TR PLAN  Patient Details Name: Steve Davenport MRN: [id number] DOB: 1976-02-15 Today's Date: 07/28/2016  Rec Therapy Plan Is patient appropriate for Therapeutic Recreation?: Yes Treatment times per week: At least once a week TR Treatment/Interventions: 1:1 session, Group participation (Comment) (Appropriate participation in daily recreational therapy tx)  Discharge Criteria Pt will be discharged from therapy if:: Treatment goals are met, Discharged Treatment plan/goals/alternatives discussed and agreed upon by:: Patient/family  Discharge Summary Short term goals set: See Care Plan Short term goals met: Complete Progress toward goals comments: One-to-one attended Which groups?: Self-esteem, Leisure education, Social skills One-to-one attended: Stress management, coping skills Reason goals not met: N/A Therapeutic equipment acquired: None Reason patient discharged from therapy: Discharge from hospital Pt/family agrees with progress & goals achieved: Yes Date patient discharged from therapy: 07/28/16   Leonette Monarch, LRT/CTRS 07/28/2016, 12:15 PM

## 2016-07-28 NOTE — Progress Notes (Signed)
[facility]charge [facility] @ [phone].

## 2016-07-29 DIAGNOSIS — Z139 Encounter for screening, unspecified: Secondary | ICD-10-CM

## 2016-07-29 LAB — GLUCOSE, POCT (MANUAL RESULT ENTRY): POC Glucose: 89 mg/dl (ref 70–99)

## 2016-07-29 NOTE — Congregational Nurse Program (Signed)
Congregational Nurse Program Note  Date of Encounter: 07/29/2016  Past Medical History: Past Medical History:  Diagnosis Date  . Anxiety   . Bipolar disorder (HCC)   . Depression   . Kidney stone   . Mental health disorder   . Polysubstance abuse     Encounter Details:     CNP Questionnaire - 07/29/16 1612      Patient Demographics   Is this a new or existing patient? New   Patient is considered a/an Not Applicable   Race Caucasian/White     Patient Assistance   Location of Patient Assistance [facility]   Patient's financial/insurance status Self-Pay (Uninsured);Low Income   Uninsured Patient ([place] Card/Care Connects) Yes   Interventions Counseled to make appt. with provider;Assisted patient in making appt.   Patient referred to apply for the following financial assistance [facility]   Food insecurities addressed Not Applicable   Transportation assistance No   Assistance securing medications No   Educational health offerings Behavioral health;Chronic disease;Navigating the healthcare system     Encounter Details   Primary purpose of visit Chronic Illness/Condition Visit;Education/Health Concerns;Navigating the Healthcare System   Was an Emergency Department visit averted? Not Applicable   Does patient have a medical provider? No   Patient referred to Establish PCP;Clinic   Was a mental health screening completed? (GAINS tool) No   Does patient have dental issues? No   Does patient have vision issues? No   Does your patient have an abnormal blood pressure today? No   Since previous encounter, have you referred patient for abnormal blood pressure that resulted in a new diagnosis or medication change? No   Does your patient have an abnormal blood glucose today? No   Since previous encounter, have you referred patient for abnormal blood glucose that resulted in a new diagnosis or medication change? No   Was there a life-saving intervention made? No      [facility] He has been there one day. He has a history of drug and alcohol abuse . He also states he has had a long history of PTSD , Bipolar Disease with history of prior suicide attempts. [facility]. Client states he has medications to last for 5 to 6 more days. He [facility], but they were unable to take him and he may go tomorrow. Client worried about getting his medications. [facility] assured RN that they will check into his mental health medicines. He currently has 4 prescriptions for medications of the  following : Seroquel 25 mg three times daily Seroquel 300 mg at bedtime daily Prozac 20 mg once daily at bedtime Minipress 2 mg one tablet twice daily. RN encouraged to give house manager his prescriptions to be filled as needed. He states they may be changed by psychiatrist when [facility].  Past Medical History: Anemia, Ulcer, Hepatitis C, GERD, PTSD, depression and anxiety. Hiatal hernia. Client states he has also had some type of breathing "problem" which required an inhaler. Client states he was treated for a "heart attack" with coronary angiography in 2006.  Past surgical history: Endoscopy? Throat surgery per client 2011( esophagus stretched)  Allergy: Tilapia : swelling of throat  Alert and oriented to person and place, answers appropriately. Visually appears to be anxious,rubbing hands over legs , fidgety. Client denies any current  thoughts of suicide or of harming others. Denies hallucinations. RN reinforced that [facility] will provide his mental health treatment and medications for mental health will be prescribed by the provider at [facility].  Will refer client into the Free clinic for medical management as his primary medical provider. Client states understanding. Referral made and appointment secured for 08/04/16 at 2 pm.  Contact information given to client along with education on GERD regarding avoiding foods that aggravate his acid reflux as well as not lying flat for 1 to 2 hours after a meal. Client states understanding.  Social: Client is not employed and states he has no income. He is currently applying for medicaid and for disability. RN encouraged client to inform the Free clinic if he receives medicaid or disability.   Will follow up as needed. Summary of visit and vitals given to client upon discharge from [facility] along with educational information regarding tips to relieve stress, stop smoking information and tips of staying healthy.  Vital signs today: 123/78, pulse 80, oxygen saturation 99% on Room Air, Resp 12. Random POCT glucose by fingerstick 89 , Temp 98.1 Lungs clear by auscultation , occasional cough, non productive. Denies chest pain or shortness of breath currently. Only complaint is he is concerned about his liver being damaged and that he is hungry. Reminded client that the free clinic will guide his medical care and address his health concerns. Water and peanut butter crackers given to client for his hunger along with a granola bar.

## 2016-08-04 ENCOUNTER — Encounter: Payer: Self-pay | Admitting: Physician Assistant

## 2016-08-04 ENCOUNTER — Ambulatory Visit: Payer: Self-pay | Admitting: Physician Assistant

## 2016-08-04 VITALS — BP 102/68 | HR 94 | Temp 97.7°F | Ht 70.0 in | Wt 170.0 lb

## 2016-08-04 DIAGNOSIS — F17219 Nicotine dependence, cigarettes, with unspecified nicotine-induced disorders: Secondary | ICD-10-CM

## 2016-08-04 DIAGNOSIS — F39 Unspecified mood [affective] disorder: Secondary | ICD-10-CM

## 2016-08-04 DIAGNOSIS — F1911 Other psychoactive substance abuse, in remission: Secondary | ICD-10-CM

## 2016-08-04 DIAGNOSIS — Z8619 Personal history of other infectious and parasitic diseases: Secondary | ICD-10-CM

## 2016-08-04 NOTE — Progress Notes (Signed)
BP 102/68 (BP Location: Left Arm, Patient Position: Sitting, Cuff Size: Large)   Pulse 94   Temp 97.7 F (36.5 C)   Ht 5\' 10"  (1.778 m)   Wt 170 lb (77.1 kg)   SpO2 98%   BMI 24.39 kg/m    Subjective:    Patient ID: Steve Davenport, male    DOB: 03-31-1976, [age] y.o.   MRN: [id number]  HPI: Steve Davenport is a 41 y.o. male presenting on 08/04/2016 for No chief complaint on file.   HPI    [facility]   Recent inpatient stay for SI.   [facility]- he has been once and he has follow up scheduled.   [place] [facility]   he had just moved in with his cousin in [place] prior to his admission  [facility].  Pt states dx with hepatitis at [facility] [place]  He says [facility] but didn't bring them.  Relevant past medical, surgical, family and social history reviewed and updated as indicated. Interim medical history since our last visit reviewed. Allergies and medications reviewed and updated.   Current Outpatient Prescriptions:  .  FLUoxetine (PROZAC) 20 MG capsule, Take 1 capsule (20 mg total) by mouth at bedtime., Disp: 30 capsule, Rfl: 1 .  prazosin (MINIPRESS) 2 MG capsule, Take 1 capsule (2 mg total) by mouth 2 (two) times daily., Disp: 60 capsule, Rfl: 1 .  traZODone (DESYREL) 100 MG tablet, Take 100 mg by mouth at bedtime., Disp: , Rfl:    Review of Systems  Constitutional: Positive for fatigue. Negative for appetite change, chills, diaphoresis, fever and unexpected weight change.  HENT: Positive for congestion and dental problem. Negative for drooling, ear pain, facial swelling, hearing loss, mouth sores, sneezing, sore throat, trouble swallowing and voice change.   Eyes: Negative for pain, discharge, redness, itching and visual disturbance.  Respiratory: Negative for cough, choking, shortness of breath and wheezing.   Cardiovascular: Negative for chest pain, palpitations and leg swelling.   Gastrointestinal: Negative for abdominal pain, blood in stool, constipation, diarrhea and vomiting.  Endocrine: Negative for cold intolerance, heat intolerance and polydipsia.  Genitourinary: Negative for decreased urine volume, dysuria and hematuria.  Musculoskeletal: Negative for arthralgias, back pain and gait problem.  Skin: Negative for rash.  Allergic/Immunologic: Negative for environmental allergies.  Neurological: Negative for seizures, syncope, light-headedness and headaches.  Hematological: Negative for adenopathy.  Psychiatric/Behavioral: Negative for agitation, dysphoric mood and suicidal ideas. The patient is not nervous/anxious.     Per HPI unless specifically indicated above     Objective:    BP 102/68 (BP Location: Left Arm, Patient Position: Sitting, Cuff Size: Large)   Pulse 94   Temp 97.7 F (36.5 C)   Ht 5\' 10"  (1.778 m)   Wt 170 lb (77.1 kg)   SpO2 98%   BMI 24.39 kg/m   Wt Readings from Last 3 Encounters:  08/04/16 170 lb (77.1 kg)  07/29/16 157 lb 12.8 oz (71.6 kg)  07/20/16 155 lb (70.3 kg)    Physical Exam  Constitutional: He is oriented to person, place, and time. He appears well-developed and well-nourished.  HENT:  Head: Normocephalic and atraumatic.  Mouth/Throat: Oropharynx is clear and moist. No oropharyngeal exudate.  Eyes: Conjunctivae and EOM are normal. Pupils are equal, round, and reactive to light.  Neck: Neck supple. No thyromegaly present.  Cardiovascular: Normal rate and regular rhythm.   Pulmonary/Chest: Effort  normal and breath sounds normal. He has no wheezes. He has no rales.  Abdominal: Soft. Bowel sounds are normal. He exhibits no mass. There is no hepatosplenomegaly. There is no tenderness.  Musculoskeletal: He exhibits no edema.  Lymphadenopathy:    He has no cervical adenopathy.  Neurological: He is alert and oriented to person, place, and time.  Skin: Skin is warm and dry. No rash noted.  Psychiatric: He has a normal  mood and affect. His behavior is normal. Thought content normal.  Vitals reviewed.       Assessment & Plan:   Encounter Diagnoses  Name Primary?  . Substance abuse in remission Yes  . Cigarette nicotine dependence with nicotine-induced disorder   . Mood disorder (HCC)   . History of hepatitis C     -Pt will bring in his hepatitis labs.  Will order any testing needed -Will Check LFTs. -Recent labs done in [facility] were reviewed -pt to continue with [facility] for mood disorder -pt is given [facility] discount application for referreal to GI for hepatitis treatment -pt to follow up in one month.  RTO sooner prn

## 2016-08-06 ENCOUNTER — Encounter: Payer: Self-pay | Admitting: Physician Assistant

## 2016-09-02 ENCOUNTER — Emergency Department (HOSPITAL_COMMUNITY)
Admission: EM | Admit: 2016-09-02 | Discharge: 2016-09-02 | Disposition: A | Discharge status: Home / Self Care | Payer: Self-pay | Attending: Emergency Medicine | Admitting: Emergency Medicine

## 2016-09-02 ENCOUNTER — Encounter (HOSPITAL_COMMUNITY): Payer: Self-pay | Admitting: Emergency Medicine

## 2016-09-02 DIAGNOSIS — W458XXA Other foreign body or object entering through skin, initial encounter: Secondary | ICD-10-CM | POA: Insufficient documentation

## 2016-09-02 DIAGNOSIS — L089 Local infection of the skin and subcutaneous tissue, unspecified: Secondary | ICD-10-CM

## 2016-09-02 DIAGNOSIS — S60451A Superficial foreign body of left index finger, initial encounter: Secondary | ICD-10-CM | POA: Insufficient documentation

## 2016-09-02 DIAGNOSIS — Y999 Unspecified external cause status: Secondary | ICD-10-CM | POA: Insufficient documentation

## 2016-09-02 DIAGNOSIS — S60459A Superficial foreign body of unspecified finger, initial encounter: Secondary | ICD-10-CM

## 2016-09-02 DIAGNOSIS — F17210 Nicotine dependence, cigarettes, uncomplicated: Secondary | ICD-10-CM | POA: Insufficient documentation

## 2016-09-02 DIAGNOSIS — Z79899 Other long term (current) drug therapy: Secondary | ICD-10-CM | POA: Insufficient documentation

## 2016-09-02 DIAGNOSIS — L255 Unspecified contact dermatitis due to plants, except food: Secondary | ICD-10-CM | POA: Insufficient documentation

## 2016-09-02 DIAGNOSIS — Y9389 Activity, other specified: Secondary | ICD-10-CM | POA: Insufficient documentation

## 2016-09-02 DIAGNOSIS — Y929 Unspecified place or not applicable: Secondary | ICD-10-CM | POA: Insufficient documentation

## 2016-09-02 MED ORDER — BACITRACIN ZINC 500 UNIT/GM EX OINT
TOPICAL_OINTMENT | CUTANEOUS | Status: AC
  Administered 2016-09-02: 1
  Filled 2016-09-02: qty 0.9

## 2016-09-02 MED ORDER — CLINDAMYCIN HCL 150 MG PO CAPS: 150.0000 mg | ORAL_CAPSULE | Freq: Four times a day (QID) | ORAL | 0 refills | Status: DC

## 2016-09-02 MED ORDER — ONDANSETRON HCL 4 MG PO TABS
4.0000 mg | ORAL_TABLET | Freq: Once | ORAL | Status: AC
  Administered 2016-09-02: 4 mg via ORAL
  Filled 2016-09-02: qty 1

## 2016-09-02 MED ORDER — DOXYCYCLINE HYCLATE 100 MG PO TABS
100.0000 mg | ORAL_TABLET | Freq: Once | ORAL | Status: AC
  Administered 2016-09-02: 100 mg via ORAL
  Filled 2016-09-02: qty 1

## 2016-09-02 MED ORDER — LIDOCAINE HCL (PF) 1 % IJ SOLN
5.0000 mL | Freq: Once | INTRAMUSCULAR | Status: AC
  Administered 2016-09-02: 5 mL
  Filled 2016-09-02: qty 5

## 2016-09-02 MED ORDER — DEXAMETHASONE SODIUM PHOSPHATE 4 MG/ML IJ SOLN
8.0000 mg | Freq: Once | INTRAMUSCULAR | Status: AC
Start: 2016-09-02 — End: 2016-09-02
  Administered 2016-09-02: 8 mg via INTRAMUSCULAR
  Filled 2016-09-02: qty 2

## 2016-09-02 NOTE — ED Notes (Signed)
edp at bedside  

## 2016-09-02 NOTE — ED Provider Notes (Signed)
[facility]-EMER[facility]ENCY DEPT Provider Note   CSN: [id number] Arrival date & time: 09/02/16  1928     History   Chief Complaint Chief Complaint  Patient presents with  . Hand Pain    HPI Steve Davenport is a 41 y.o. male.  Patient is a 41 year old male who presents to the emergency department with a complaint of swelling and infection of the left index finger, and poison ivy.  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 He had a splinter to go into his finger through his glove. This occurred approximately 3 or 4 days ago. The patient states he pulled it out, but he felt that a little piece of it was still in on the skin. Over the next 3 days, the area began to swell and then it developed a pus area. The patient states it is painful to touch and painful to move his finger. He states that his last tetanus shot was probably 4 or 5 years ago. He presents now for assistance with this problem.      Past Medical History:  Diagnosis Date  . Anxiety   . Bipolar disorder (HCC)   . Depression   . Kidney stone   . Mental health disorder   . Myocardial infarction (HCC) 2006  . Polysubstance abuse     Patient Active Problem List   Diagnosis Date Noted  . Bipolar I disorder, current or most recent episode depressed, with psychotic features (HCC) 07/22/2016  . Opioid withdrawal (HCC) 07/22/2016  . Opioid use disorder, severe, dependence (HCC) 07/22/2016  . Tobacco use disorder 07/22/2016  . Sedative, hypnotic or anxiolytic use disorder, severe, dependence (HCC) 07/22/2016    Past Surgical History:  Procedure Laterality Date  . CARDIAC CATHETERIZATION  11/05/2005   normal coronary arteries with EF 50% ([facility])  . ESOPHAGEAL DILATION         Home Medications    Prior to Admission medications   Medication Sig Start Date End Date Taking? Authorizing Provider  clindamycin (CLEOCIN) 150 MG capsule Take 1 capsule (150 mg total) by mouth 4 (four) times daily. 09/02/16    Ivery Quale, PA-C  FLUoxetine (PROZAC) 20 MG capsule Take 1 capsule (20 mg total) by mouth at bedtime. 07/25/16   Shari Prows, MD  prazosin (MINIPRESS) 2 MG capsule Take 1 capsule (2 mg total) by mouth 2 (two) times daily. 07/25/16   Shari Prows, MD  traZODone (DESYREL) 100 MG tablet Take 100 mg by mouth at bedtime.    Historical Provider, MD    Family History Family History  Problem Relation Age of Onset  . Cancer Mother     breast  . Cancer Father     lymphoma    Social History Social History  Substance Use Topics  . Smoking status: Current Every Day Smoker    Packs/day: 0.50    Types: Cigarettes  . Smokeless tobacco: Never Used  . Alcohol use No     Comment: history heavy etoh in his 20's     Allergies   Tilapia [fish allergy] and Tramadol   Review of Systems Review of Systems  Constitutional: Negative for activity change.       All ROS Neg except as noted in HPI  HENT: Negative for nosebleeds.   Eyes: Negative for photophobia and discharge.  Respiratory: Negative for cough, shortness of breath and wheezing.   Cardiovascular: Negative for chest pain and palpitations.  Gastrointestinal: Negative for abdominal pain and blood in  stool.  Genitourinary: Negative for dysuria, frequency and hematuria.  Musculoskeletal: Negative for arthralgias, back pain and neck pain.  Skin: Negative.   Neurological: Negative for dizziness, seizures and speech difficulty.  Psychiatric/Behavioral: Negative for confusion and hallucinations.     Physical Exam Updated Vital Signs BP (!) 112/58 (BP Location: Right Arm)   Pulse 66   Temp 98.3 F (36.8 C) (Oral)   Resp 18   Ht  (1.753 m)   Wt 77.1 kg   SpO2 96%   BMI 25.10 kg/m   Physical Exam  Constitutional: He is oriented to person, place, and time. He appears well-developed and well-nourished.  Non-toxic appearance.  HENT:  Head: Normocephalic.  Right Ear: Tympanic membrane and external ear normal.    Left Ear: Tympanic membrane and external ear normal.  Eyes: EOM and lids are normal. Pupils are equal, round, and reactive to light.  Neck: Normal range of motion. Neck supple. Carotid bruit is not present.  Cardiovascular: Normal rate, regular rhythm, normal heart sounds, intact distal pulses and normal pulses.   Pulmonary/Chest: Breath sounds normal. No respiratory distress.  Abdominal: Soft. Bowel sounds are normal. There is no tenderness. There is no guarding.  Musculoskeletal: Normal range of motion. He exhibits tenderness.  There is some increased redness of the left index finger. On the palmar surface at the PIP joint, there is a raised pus filled area that is tender to touch. There no red streaks going up the hand. There is full range of motion of the fingers and wrist of the left upper extremity. Capillary refill is less than 2 seconds.  Lymphadenopathy:       Head (right side): No submandibular adenopathy present.       Head (left side): No submandibular adenopathy present.    He has no cervical adenopathy.  Neurological: He is alert and oriented to person, place, and time. He has normal strength. No cranial nerve deficit or sensory deficit.  Skin: Skin is warm and dry.  There is a red raised rash on the inner aspect of the right and left arm. There is also an area on the lower extremity. A few of these have a blister, consistent with contact dermatitis.  Psychiatric: He has a normal mood and affect. His speech is normal.  Nursing note and vitals reviewed.    ED Treatments / Results  Labs (all labs ordered are listed, but only abnormal results are displayed) Labs Reviewed  AEROBIC CULTURE (SUPERFICIAL SPECIMEN)    EKG  EKG Interpretation None       Radiology No results found.  Procedures .Foreign Body Removal Date/Time: 09/02/2016 9:19 PM Performed by: Ivery Quale Authorized by: Ivery Quale  Consent: Verbal consent obtained. Risks and benefits: risks,  benefits and alternatives were discussed Consent given by: patient Patient understanding: patient states understanding of the procedure being performed Patient identity confirmed: arm band Time out: Immediately prior to procedure a "time out" was called to verify the correct patient, procedure, equipment, support staff and site/side marked as required. Body area: skin General location: upper extremity Location details: left index finger Anesthesia: digital block  Anesthesia: Local Anesthetic: lidocaine 1% without epinephrine Patient cooperative: yes Localization method: probed Removal mechanism: forceps and scalpel Dressing: dressing applied Tendon involvement: none Depth: subcutaneous Complexity: simple 1 objects recovered. Objects recovered: small splinter Post-procedure assessment: foreign body removed Patient tolerance: Patient tolerated the procedure well with no immediate complications Comments: Culture sent to the lab.   (including critical care time)  Medications Ordered in ED Medications  lidocaine (PF) (XYLOCAINE) 1 % injection 5 mL (5 mLs Other Given by Other 09/02/16 2000)  dexamethasone (DECADRON) injection 8 mg (8 mg Intramuscular Given 09/02/16 2109)  doxycycline (VIBRA-TABS) tablet 100 mg (100 mg Oral Given 09/02/16 2109)  ondansetron (ZOFRAN) tablet 4 mg (4 mg Oral Given 09/02/16 2109)     Initial Impression / Assessment and Plan / ED Course  I have reviewed the triage vital signs and the nursing notes.  Pertinent labs & imaging results that were available during my care of the patient were reviewed by me and considered in my medical decision making (see chart for details).     **I have reviewed nursing notes, vital signs, and all appropriate lab and imaging results for this patient.*  Final Clinical Impressions(s) / ED Diagnoses MDM Patient sustained a splinter in the index finger of the left hand approximately 3 or 4 days ago. He has come in contact with  poison ivy at his work on. And he now has rash on the inner aspect of his arms, and also on the lower extremities. Foreign body was removed, the pus area was drained on. A new dressing was applied. The patient was treated with intramuscular Decadron for the poison ivy.  The patient will change his dressings daily. He will see Mr. McElroy, or return to the emergency department if any signs of advancing infection. Prescription for clindamycin given to the patient.    Final diagnoses:  Foreign body in finger-infected, initial encounter    New Prescriptions New Prescriptions   CLINDAMYCIN (CLEOCIN) 150 MG CAPSULE    Take 1 capsule (150 mg total) by mouth 4 (four) times daily.     Ivery Quale, PA-C 09/02/16 2121    Samuel Jester, DO 09/05/16 1537

## 2016-09-02 NOTE — ED Triage Notes (Signed)
Pt c/o left index finger swelling after doing yard work.

## 2016-09-02 NOTE — Discharge Instructions (Signed)
Please soak your finger for about 10 minutes in warm Epsom salt water. Do this until wound heals from the inside out. Starting February 26, you may use a regular Band-Aid to cover the wound. Please use clindamycin with breakfast, lunch, dinner, and at bedtime. Please see Steve Davenport or return to the emergency department if any signs of advancing infection. You may use Tylenol every 4 hours, or ibuprofen every 6 hours for discomfort if needed.

## 2016-09-04 ENCOUNTER — Encounter: Payer: Self-pay | Admitting: Physician Assistant

## 2016-09-04 ENCOUNTER — Ambulatory Visit: Payer: Self-pay | Admitting: Physician Assistant

## 2016-09-04 VITALS — BP 100/60 | HR 67 | Temp 98.1°F | Ht 70.0 in | Wt 172.8 lb

## 2016-09-04 DIAGNOSIS — F1911 Other psychoactive substance abuse, in remission: Secondary | ICD-10-CM

## 2016-09-04 DIAGNOSIS — B1920 Unspecified viral hepatitis C without hepatic coma: Secondary | ICD-10-CM

## 2016-09-04 DIAGNOSIS — F17219 Nicotine dependence, cigarettes, with unspecified nicotine-induced disorders: Secondary | ICD-10-CM

## 2016-09-04 DIAGNOSIS — F39 Unspecified mood [affective] disorder: Secondary | ICD-10-CM

## 2016-09-04 NOTE — Progress Notes (Signed)
BP 100/60 (BP Location: Left Arm, Patient Position: Sitting, Cuff Size: Normal)   Pulse 67   Temp 98.1 F (36.7 C)   Ht  (1.778 m)   Wt 172 lb 12 oz (78.4 kg)   SpO2 97%   BMI 24.79 kg/m    Subjective:    Patient ID: Steve Davenport, male    DOB: [birth year], 41 y.o.   MRN: [id number]  HPI: Steve Davenport is a 41 y.o. male presenting on 09/04/2016 for Follow-up   HPI   Pt today bringing in lab form with + hepatitis C labwork.  It was done 11/19/2015.    Pt seen in ER yesterday for splinter.  He says he got a shot for the poison ivy also  Pt says his L ear. sounds/feels like something crawling in ear occurs 7-8 times a day. pt states it itches  Pt submitted [facility] discount application few weeks ago  Relevant past medical, surgical, family and social history reviewed and updated as indicated. Interim medical history since our last visit reviewed. Allergies and medications reviewed and updated.   Current Outpatient Prescriptions:  .  clindamycin (CLEOCIN) 150 MG capsule, Take 1 capsule (150 mg total) by mouth 4 (four) times daily., Disp: 28 capsule, Rfl: 0 .  FLUoxetine (PROZAC) 20 MG capsule, Take 1 capsule (20 mg total) by mouth at bedtime., Disp: 30 capsule, Rfl: 1 .  traZODone (DESYREL) 100 MG tablet, Take 100 mg by mouth at bedtime., Disp: , Rfl:  .  prazosin (MINIPRESS) 2 MG capsule, Take 1 capsule (2 mg total) by mouth 2 (two) times daily. (Patient not taking: Reported on 09/04/2016), Disp: 60 capsule, Rfl: 1   Review of Systems  Constitutional: Negative for appetite change, chills, diaphoresis, fatigue, fever and unexpected weight change.  HENT: Positive for ear pain. Negative for congestion, dental problem, drooling, facial swelling, hearing loss, mouth sores, sneezing, sore throat, trouble swallowing and voice change.   Eyes: Negative for pain, discharge, redness, itching and visual disturbance.  Respiratory: Negative for cough, choking, shortness of breath and  wheezing.   Cardiovascular: Negative for chest pain, palpitations and leg swelling.  Gastrointestinal: Negative for abdominal pain, blood in stool, constipation, diarrhea and vomiting.  Endocrine: Negative for cold intolerance, heat intolerance and polydipsia.  Genitourinary: Negative for decreased urine volume, dysuria and hematuria.  Musculoskeletal: Negative for arthralgias, back pain and gait problem.  Skin: Negative for rash.  Allergic/Immunologic: Negative for environmental allergies.  Neurological: Negative for seizures, syncope, light-headedness and headaches.  Hematological: Negative for adenopathy.  Psychiatric/Behavioral: Negative for agitation, dysphoric mood and suicidal ideas. The patient is not nervous/anxious.     Per HPI unless specifically indicated above     Objective:    BP 100/60 (BP Location: Left Arm, Patient Position: Sitting, Cuff Size: Normal)   Pulse 67   Temp 98.1 F (36.7 C)   Ht  (1.778 m)   Wt 172 lb 12 oz (78.4 kg)   SpO2 97%   BMI 24.79 kg/m   Wt Readings from Last 3 Encounters:  09/04/16 172 lb 12 oz (78.4 kg)  09/02/16 170 lb (77.1 kg)  08/04/16 170 lb (77.1 kg)    Physical Exam  Constitutional: He is oriented to person, place, and time. He appears well-developed and well-nourished.  HENT:  Head: Normocephalic and atraumatic.  Neck: Neck supple.  Cardiovascular: Normal rate and regular rhythm.   Pulmonary/Chest: Effort normal and breath sounds normal. He has no wheezes.  Abdominal: Soft. Bowel sounds  are normal. There is no hepatosplenomegaly. There is no tenderness.  Musculoskeletal: He exhibits no edema.  Lymphadenopathy:    He has no cervical adenopathy.  Neurological: He is alert and oriented to person, place, and time.  Skin: Skin is warm and dry.  Psychiatric: He has a normal mood and affect. His behavior is normal.  Vitals reviewed.       Assessment & Plan:    Encounter Diagnoses  Name Primary?  . Hepatitis C  virus infection without hepatic coma, unspecified chronicity Yes  . Substance abuse in remission   . Cigarette nicotine dependence with nicotine-induced disorder   . Mood disorder (HCC)       -Check hepatitis labs  -Refer to gi -f/u 4 months.  RTO sooner prn

## 2016-09-05 LAB — HEPATIC FUNCTION PANEL
ALBUMIN: 4.3 g/dL (ref 3.6–5.1)
ALT: 88 U/L — AB (ref 9–46)
AST: 61 U/L — ABNORMAL HIGH (ref 10–40)
Alkaline Phosphatase: 44 U/L (ref 40–115)
BILIRUBIN DIRECT: 0.1 mg/dL (ref <=0.2)
Indirect Bilirubin: 0.3 mg/dL (ref 0.2–1.2)
TOTAL PROTEIN: 7 g/dL (ref 6.1–8.1)
Total Bilirubin: 0.4 mg/dL (ref 0.2–1.2)

## 2016-09-05 LAB — AEROBIC CULTURE  (SUPERFICIAL SPECIMEN): SPECIAL REQUESTS: NORMAL

## 2016-09-05 LAB — AEROBIC CULTURE W GRAM STAIN (SUPERFICIAL SPECIMEN)

## 2016-09-06 ENCOUNTER — Telehealth: Payer: Self-pay

## 2016-09-06 NOTE — Telephone Encounter (Signed)
Post ED Visit - Positive Culture Follow-up  Culture report reviewed by antimicrobial stewardship pharmacist:   Enzo Bi, Pharm.D.  Celedonio Miyamoto, Pharm.D., BCPS AQ-ID  Garvin Fila, Pharm.D., BCPS  Georgina Pillion, Pharm.D., BCPS  [place], [place].D., BCPS, AAHIVP  Estella Husk, Pharm.D., BCPS, AAHIVP  Lysle Pearl, PharmD, BCPS  Casilda Carls, PharmD, BCPS  Pollyann Samples, PharmD, BCPS  Positive aerobic culture Treated with Clindamycin, organism sensitive to the same and no further patient follow-up is required at this time.  Jerry Caras 09/06/2016, 10:21 AM

## 2016-09-08 LAB — HEPATITIS C RNA QUANTITATIVE
HCV QUANT LOG: 4.57 {Log_IU}/mL — AB
HCV QUANT: 37000 [IU]/mL — AB

## 2016-09-11 LAB — HEPATITIS C GENOTYPE

## 2016-09-15 ENCOUNTER — Encounter: Payer: Self-pay | Admitting: Internal Medicine

## 2016-10-08 ENCOUNTER — Other Ambulatory Visit: Payer: Self-pay

## 2016-10-08 ENCOUNTER — Ambulatory Visit (INDEPENDENT_AMBULATORY_CARE_PROVIDER_SITE_OTHER): Payer: Self-pay | Admitting: Gastroenterology

## 2016-10-08 ENCOUNTER — Encounter: Payer: Self-pay | Admitting: Gastroenterology

## 2016-10-08 VITALS — BP 110/59 | HR 85 | Temp 98.5°F | Ht 69.0 in | Wt 169.6 lb

## 2016-10-08 DIAGNOSIS — B182 Chronic viral hepatitis C: Secondary | ICD-10-CM

## 2016-10-08 DIAGNOSIS — R1310 Dysphagia, unspecified: Secondary | ICD-10-CM | POA: Insufficient documentation

## 2016-10-08 DIAGNOSIS — R1319 Other dysphagia: Secondary | ICD-10-CM

## 2016-10-08 DIAGNOSIS — R1012 Left upper quadrant pain: Secondary | ICD-10-CM

## 2016-10-08 MED ORDER — ESOMEPRAZOLE MAGNESIUM 20 MG PO CPDR: 20.0000 mg | DELAYED_RELEASE_CAPSULE | Freq: Every day | ORAL | 0 refills | Status: DC

## 2016-10-08 NOTE — Progress Notes (Signed)
Primary Care Physician:  Jacquelin HawkingMcElroy, Shannon, PA-C  Primary Gastroenterologist:  Roetta SessionsMichael Rourk, MD   Chief Complaint  Patient presents with  . Hepatitis C  . Dysphagia    HPI:  Steve Davenport is a 41 y.o. male here at the request of PCP for further evaluation of chronic Hep C. Patient states he was diagnosed with Hep C awhile back. He's not sure how long he's had it. H/O IV drug use. Addiction to opoids initially in his 3920s when he had a traumatic injury. Then he moved on to street drugs like heroin, cocaine. Has been in Methadone program in past. Currently off methadone for four months. Residing at [facility], just completed first ninety day program. Patient has h/o psych admission in 07/2016 for SI. [place]. [facility].   Patient reports progressive dysphagia which is HIS primary concern today. He had EGD/ED about 10 years ago for dysphagia while in prison. Over the past several weeks he is having to puree his food in a blender. Has lost 9 pounds. C/o nausea with vomiting. Yesterday some hematemesis after several episodes of vomiting. Complains of dull ache in the luq. Occasional sharp shooting ruq pain. Not necessarily related to meals. Cannot swallow rice, breat, meat. Almost always ith heartburn. Cannot tolerate NSAIDS/ASA. Takes ranitidine and/or omeprazole as needed. C/o odynophagia.    [place] in [place]. [place],TCS in  [place] for bleeding and states it was clear.   States he has had etoh pancreatitis in the past. H/o remote PUD.   Has ben clean/sober for four months.    Current Outpatient Prescriptions  Medication Sig Dispense Refill  . FLUoxetine (PROZAC) 20 MG capsule Take 1 capsule (20 mg total) by mouth at bedtime. 30 capsule 1  . traZODone (DESYREL) 100 MG tablet Take 100 mg by mouth at bedtime.    Marland Kitchen. esomeprazole (NEXIUM) 20 MG capsule Take 1 capsule (20 mg total) by mouth daily before  breakfast. 20 capsule 0   No current facility-administered medications for this visit.     Allergies as of 10/08/2016 - Review Complete 10/08/2016  Allergen Reaction Noted  . Tilapia [fish allergy] Shortness Of Breath 07/22/2016  . Tramadol Itching 07/20/2016    Past Medical History:  Diagnosis Date  . Anxiety   . Bipolar disorder (HCC)   . Depression   . Kidney stone   . Mental health disorder   . Myocardial infarction [facility](HCC) 2006   stress/anxiety. cath done no damage. no stents.   . Pancreatitis    three hospitalizations, per patient secondary to etoh  . Polysubstance abuse     Past Surgical History:  Procedure Laterality Date  . CARDIAC CATHETERIZATION  11/05/2005   normal coronary arteries with EF 50% ([facility])  . COLONOSCOPY    . ESOPHAGEAL DILATION      Family History  Problem Relation Age of Onset  . Cancer Mother        breast  . Cancer Father        lymphoma    Social History   Social History  . Marital status: Single    Spouse name: N/A  . Number of children: N/A  . Years of education: N/A   Occupational History  . Not on file.   Social History Main Topics  . Smoking status: Current Every Day Smoker    Packs/day: 0.50    Types: Cigarettes  . Smokeless tobacco: Never Used  . Alcohol use No  Comment: history heavy etoh in his 27's, quit at age 64  . Drug use: No     Comment: last use 05/2016  . Sexual activity: Not on file   Other Topics Concern  . Not on file   Social History Narrative  . No narrative on file      ROS:  General: Negative for anorexia, fever, chills, fatigue, weakness.see hpi.  Eyes: Negative for vision changes.  ENT: Negative for hoarseness   nasal congestion.see hpi CV: Negative for chest pain, angina, palpitations, dyspnea on exertion, peripheral edema.  Respiratory: Negative for dyspnea at rest, dyspnea on exertion, cough, sputum, wheezing.  GI: See history of present illness. GU:  Negative for dysuria,  hematuria, urinary incontinence, urinary frequency, nocturnal urination.  MS: Negative for joint pain, low back pain.  Derm: Negative for rash or itching.  Neuro: Negative for weakness, abnormal sensation, seizure, frequent headaches, memory loss, confusion.  Psych: Negative for anxiety, depression, suicidal ideation, hallucinations.  Endo: Negative for unusual weight change.  Heme: Negative for bruising or bleeding. Allergy: Negative for rash or hives.    Physical Examination:  BP (!) 110/59   Pulse 85   Temp 98.5 F (36.9 C) (Oral)   Ht 5\' 9"  (1.753 m)   Wt 169 lb 9.6 oz (76.9 kg)   BMI 25.05 kg/m    General: Well-nourished, well-developed in no acute distress.  Head: Normocephalic, atraumatic.   Eyes: Conjunctiva pink, no icterus. Mouth: Oropharyngeal mucosa moist and pink , no lesions erythema or exudate. Neck: Supple without thyromegaly, masses, or lymphadenopathy.  Lungs: Clear to auscultation bilaterally.  Heart: Regular rate and rhythm, no murmurs rubs or gallops.  Abdomen: Bowel sounds are normal, nontender, nondistended, no hepatosplenomegaly or masses, no abdominal bruits or    hernia , no rebound or guarding.   Rectal: not performed Extremities: No lower extremity edema. No clubbing or deformities.  Neuro: Alert and oriented x 4 , grossly normal neurologically.  Skin: Warm and dry, no rash or jaundice.   Psych: Alert and cooperative, normal mood and affect.  Labs: Labs from April 2018, HCV quantitative 37,000 international units per mL, genotype 1A  Lab Results  Component Value Date   ALT 88 (H) 09/04/2016   AST 61 (H) 09/04/2016   ALKPHOS 44 09/04/2016   BILITOT 0.4 09/04/2016   Lab Results  Component Value Date   CREATININE 0.92 07/20/2016   BUN 11 07/20/2016   NA 138 07/20/2016   K 3.7 07/20/2016   CL 105 07/20/2016   CO2 26 07/20/2016   Lab Results  Component Value Date   WBC 8.1 07/20/2016   HGB 13.9 07/20/2016   HCT 41.5 07/20/2016   MCV  89.6 07/20/2016   PLT 219 07/20/2016   Lab Results  Component Value Date   TSH 2.022 07/25/2016     Imaging Studies: No results found.

## 2016-10-08 NOTE — Patient Instructions (Addendum)
1. Upper endoscopy with esophageal dilation in the near future. Please see separate instructions. 2. Please have your labs done. 3. Please have your ultrasound done. 4. Continue to avoid alcohol, illicit drugs. 5. Do not share razor blades, needles, nail clippers, toothbrushes. Use barrier protection i.e. condoms with sexual intercourse. 6. Recommend omeprazole 20 mg daily, you may buy over-the-counter. If you don't want to buy this we have provided you with similar medication, Nexium you can take once daily before breakfast. Take ONE not both. 7. We will try to get records from [facility] for review.

## 2016-10-09 ENCOUNTER — Telehealth: Payer: Self-pay

## 2016-10-09 ENCOUNTER — Encounter: Payer: Self-pay | Admitting: Gastroenterology

## 2016-10-09 ENCOUNTER — Other Ambulatory Visit (HOSPITAL_COMMUNITY)
Admission: RE | Admit: 2016-10-09 | Discharge: 2016-10-09 | Disposition: A | Discharge status: Home / Self Care | Payer: Self-pay | Source: Ambulatory Visit | Attending: Physician Assistant | Admitting: Physician Assistant

## 2016-10-09 LAB — PROTIME-INR
INR: 1.02
PROTHROMBIN TIME: 13.4 s (ref 11.4–15.2)

## 2016-10-09 NOTE — Telephone Encounter (Signed)
Called and informed pt of pre-op appt 11/03/16 at 10:00am. Letter also mailed.

## 2016-10-10 LAB — HEPATITIS B SURFACE ANTIGEN: Hepatitis B Surface Ag: NEGATIVE

## 2016-10-10 LAB — HEPATITIS B SURFACE ANTIBODY,QUALITATIVE: Hep B S Ab: NONREACTIVE

## 2016-10-10 LAB — HIV ANTIBODY (ROUTINE TESTING W REFLEX): HIV Screen 4th Generation wRfx: NONREACTIVE

## 2016-10-10 LAB — HEPATITIS B CORE ANTIBODY, TOTAL: Hep B Core Total Ab: NEGATIVE

## 2016-10-12 NOTE — Assessment & Plan Note (Signed)
Chronic HCV, treatment naive, genotype 1A. H/O polysubstance abuse, currently in remission for 3 months. Plan for labs, ultrasound. Discussed treatment options. Discussed need for compliance, maintain sobriety, modes of transmission. He is aware that if his Hep C is cured that he can still get reinfected if he does not avoid risky behavior. Await EGD findings, labs, u/s prior to initiating HCV treatment.

## 2016-10-12 NOTE — Assessment & Plan Note (Signed)
Progressive esophageal dysphagia/odynophagia to solid foods, only eating pureed food at this point. Suspect esophageal stricture.plan for EGD/ED with deep sedation in the OR.  I have discussed the risks, alternatives, benefits with regards to but not limited to the risk of reaction to medication, bleeding, infection, perforation and the patient is agreeable to proceed. Written consent to be obtained.  Omeprazole or Nexium TCS once daily before breakfast.

## 2016-10-13 NOTE — Progress Notes (Signed)
cc'ed to pcp °

## 2016-10-15 ENCOUNTER — Encounter: Payer: Self-pay | Admitting: Physician Assistant

## 2016-10-15 NOTE — Progress Notes (Signed)
HIV negative. Hep B negative.  Fibrosure pending.  Await u/s. EGD as planned.

## 2016-10-17 ENCOUNTER — Ambulatory Visit (HOSPITAL_COMMUNITY): Admission: RE | Admit: 2016-10-17 | Payer: Self-pay | Source: Ambulatory Visit

## 2016-10-17 LAB — MISC LABCORP TEST (SEND OUT)
LABCORP TEST CODE: 550123
LabCorp test name: 550123

## 2016-10-24 ENCOUNTER — Ambulatory Visit (HOSPITAL_COMMUNITY)
Admission: RE | Admit: 2016-10-24 | Discharge: 2016-10-24 | Disposition: A | Discharge status: Home / Self Care | Payer: Self-pay | Source: Ambulatory Visit | Attending: Gastroenterology | Admitting: Gastroenterology

## 2016-10-24 DIAGNOSIS — R1319 Other dysphagia: Secondary | ICD-10-CM

## 2016-10-24 DIAGNOSIS — R1310 Dysphagia, unspecified: Secondary | ICD-10-CM | POA: Insufficient documentation

## 2016-10-24 DIAGNOSIS — B182 Chronic viral hepatitis C: Secondary | ICD-10-CM | POA: Insufficient documentation

## 2016-10-30 NOTE — Patient Instructions (Signed)
Steve Davenport  10/30/2016     @PREFPERIOPPHARMACY @   Your procedure is scheduled on  11/10/2016   Report to [facility] at  1230 P.M.  Call this number if you have problems the morning of surgery:  [phone]   Remember:  Do not eat food or drink liquids after midnight.  Take these medicines the morning of surgery with A SIP OF WATER  nexium.   Do not wear jewelry, make-up or nail polish.  Do not wear lotions, powders, or perfumes, or deoderant.  Do not shave 48 hours prior to surgery.  Men may shave face and neck.  Do not bring valuables to the hospital.  [facility] is not responsible for any belongings or valuables.  Contacts, dentures or bridgework may not be worn into surgery.  Leave your suitcase in the car.  After surgery it may be brought to your room.  For patients admitted to the hospital, discharge time will be determined by your treatment team.  Patients discharged the day of surgery will not be allowed to drive home.   Name and phone number of your driver:   family Special instructions:  Follow the diet instructions given to you by Dr Luvenia Starch office.  Please read over the following fact sheets that you were given. Anesthesia Post-op Instructions and Care and Recovery After Surgery       Esophagogastroduodenoscopy Esophagogastroduodenoscopy (EGD) is a procedure to examine the lining of the esophagus, stomach, and first part of the small intestine (duodenum). This procedure is done to check for problems such as inflammation, bleeding, ulcers, or growths. During this procedure, a long, flexible, lighted tube with a camera attached (endoscope) is inserted down the throat. Tell a health care provider about:  Any allergies you have.  All medicines you are taking, including vitamins, herbs, eye drops, creams, and over-the-counter medicines.  Any problems you or family members have had with anesthetic medicines.  Any blood disorders you  have.  Any surgeries you have had.  Any medical conditions you have.  Whether you are pregnant or may be pregnant. What are the risks? Generally, this is a safe procedure. However, problems may occur, including:  Infection.  Bleeding.  A tear (perforation) in the esophagus, stomach, or duodenum.  Trouble breathing.  Excessive sweating.  Spasms of the larynx.  A slowed heartbeat.  Low blood pressure.  What happens before the procedure?  Follow instructions from your health care provider about eating or drinking restrictions.  Ask your health care provider about: ? Changing or stopping your regular medicines. This is especially important if you are taking diabetes medicines or blood thinners. ? Taking medicines such as aspirin and ibuprofen. These medicines can thin your blood. Do not take these medicines before your procedure if your health care provider instructs you not to.  Plan to have someone take you home after the procedure.  If you wear dentures, be ready to remove them before the procedure. What happens during the procedure?  To reduce your risk of infection, your health care team will wash or sanitize their hands.  An IV tube will be put in a vein in your hand or arm. You will get medicines and fluids through this tube.  You will be given one or more of the following: ? A medicine to help you relax (sedative). ? A medicine to numb the area (local anesthetic). This medicine may be sprayed into your throat. It will  make you feel more comfortable and keep you from gagging or coughing during the procedure. ? A medicine for pain.  A mouth guard may be placed in your mouth to protect your teeth and to keep you from biting on the endoscope.  You will be asked to lie on your left side.  The endoscope will be lowered down your throat into your esophagus, stomach, and duodenum.  Air will be put into the endoscope. This will help your health care provider see  better.  The lining of your esophagus, stomach, and duodenum will be examined.  Your health care provider may: ? Take a tissue sample so it can be looked at in a lab (biopsy). ? Remove growths. ? Remove objects (foreign bodies) that are stuck. ? Treat any bleeding with medicines or other devices that stop tissue from bleeding. ? Widen (dilate) or stretch narrowed areas of your esophagus and stomach.  The endoscope will be taken out. The procedure may vary among health care providers and hospitals. What happens after the procedure?  Your blood pressure, heart rate, breathing rate, and blood oxygen level will be monitored often until the medicines you were given have worn off.  Do not eat or drink anything until the numbing medicine has worn off and your gag reflex has returned. This information is not intended to replace advice given to you by your health care provider. Make sure you discuss any questions you have with your health care provider. Document Released: 08/29/2004 Document Revised: 10/04/2015 Document Reviewed: 03/22/2015 Elsevier Interactive Patient Education  2018 [employer] Esophagogastroduodenoscopy, Care After Refer to this sheet in the next few weeks. These instructions provide you with information about caring for yourself after your procedure. Your health care provider may also give you more specific instructions. Your treatment has been planned according to current medical practices, but problems sometimes occur. Call your health care provider if you have any problems or questions after your procedure. What can I expect after the procedure? After the procedure, it is common to have:  A sore throat.  Nausea.  Bloating.  Dizziness.  Fatigue.  Follow these instructions at home:  Do not eat or drink anything until the numbing medicine (local anesthetic) has worn off and your gag reflex has returned. You will know that the local anesthetic has worn off when you  can swallow comfortably.  Do not drive for 24 hours if you received a medicine to help you relax (sedative).  If your health care provider took a tissue sample for testing during the procedure, make sure to get your test results. This is your responsibility. Ask your health care provider or the department performing the test when your results will be ready.  Keep all follow-up visits as told by your health care provider. This is important. Contact a health care provider if:  You cannot stop coughing.  You are not urinating.  You are urinating less than usual. Get help right away if:  You have trouble swallowing.  You cannot eat or drink.  You have throat or chest pain that gets worse.  You are dizzy or light-headed.  You faint.  You have nausea or vomiting.  You have chills.  You have a fever.  You have severe abdominal pain.  You have black, tarry, or bloody stools. This information is not intended to replace advice given to you by your health care provider. Make sure you discuss any questions you have with your health care provider. Document Released: 04/14/2012  Document Revised: 10/04/2015 Document Reviewed: 03/22/2015 Elsevier Interactive Patient Education  2018 [employer].  Monitored Anesthesia Care Anesthesia is a term that refers to techniques, procedures, and medicines that help a person stay safe and comfortable during a medical procedure. Monitored anesthesia care, or sedation, is one type of anesthesia. Your anesthesia specialist may recommend sedation if you will be having a procedure that does not require you to be unconscious, such as:  Cataract surgery.  A dental procedure.  A biopsy.  A colonoscopy.  During the procedure, you may receive a medicine to help you relax (sedative). There are three levels of sedation:  Mild sedation. At this level, you may feel awake and relaxed. You will be able to follow directions.  Moderate sedation. At this  level, you will be sleepy. You may not remember the procedure.  Deep sedation. At this level, you will be asleep. You will not remember the procedure.  The more medicine you are given, the deeper your level of sedation will be. Depending on how you respond to the procedure, the anesthesia specialist may change your level of sedation or the type of anesthesia to fit your needs. An anesthesia specialist will monitor you closely during the procedure. Let your health care provider know about:  Any allergies you have.  All medicines you are taking, including vitamins, herbs, eye drops, creams, and over-the-counter medicines.  Any use of steroids (by mouth or as a cream).  Any problems you or family members have had with sedatives and anesthetic medicines.  Any blood disorders you have.  Any surgeries you have had.  Any medical conditions you have, such as sleep apnea.  Whether you are pregnant or may be pregnant.  Any use of cigarettes, alcohol, or street drugs. What are the risks? Generally, this is a safe procedure. However, problems may occur, including:  Getting too much medicine (oversedation).  Nausea.  Allergic reaction to medicines.  Trouble breathing. If this happens, a breathing tube may be used to help with breathing. It will be removed when you are awake and breathing on your own.  Heart trouble.  Lung trouble.  Before the procedure Staying hydrated Follow instructions from your health care provider about hydration, which may include:  Up to 2 hours before the procedure - you may continue to drink clear liquids, such as water, clear fruit juice, black coffee, and plain tea.  Eating and drinking restrictions Follow instructions from your health care provider about eating and drinking, which may include:  8 hours before the procedure - stop eating heavy meals or foods such as meat, fried foods, or fatty foods.  6 hours before the procedure - stop eating light  meals or foods, such as toast or cereal.  6 hours before the procedure - stop drinking milk or drinks that contain milk.  2 hours before the procedure - stop drinking clear liquids.  Medicines Ask your health care provider about:  Changing or stopping your regular medicines. This is especially important if you are taking diabetes medicines or blood thinners.  Taking medicines such as aspirin and ibuprofen. These medicines can thin your blood. Do not take these medicines before your procedure if your health care provider instructs you not to.  Tests and exams  You will have a physical exam.  You may have blood tests done to show: ? How well your kidneys and liver are working. ? How well your blood can clot.  General instructions  Plan to have someone take you  home from the hospital or clinic.  If you will be going home right after the procedure, plan to have someone with you for 24 hours.  What happens during the procedure?  Your blood pressure, heart rate, breathing, level of pain and overall condition will be monitored.  An IV tube will be inserted into one of your veins.  Your anesthesia specialist will give you medicines as needed to keep you comfortable during the procedure. This may mean changing the level of sedation.  The procedure will be performed. After the procedure  Your blood pressure, heart rate, breathing rate, and blood oxygen level will be monitored until the medicines you were given have worn off.  Do not drive for 24 hours if you received a sedative.  You may: ? Feel sleepy, clumsy, or nauseous. ? Feel forgetful about what happened after the procedure. ? Have a sore throat if you had a breathing tube during the procedure. ? Vomit. This information is not intended to replace advice given to you by your health care provider. Make sure you discuss any questions you have with your health care provider. Document Released: 01/22/2005 Document Revised:  10/05/2015 Document Reviewed: 08/19/2015 Elsevier Interactive Patient Education  2018 [employer]vier Inc. Monitored Anesthesia Care, Care After These instructions provide you with information about caring for yourself after your procedure. Your health care provider may also give you more specific instructions. Your treatment has been planned according to current medical practices, but problems sometimes occur. Call your health care provider if you have any problems or questions after your procedure. What can I expect after the procedure? After your procedure, it is common to:  Feel sleepy for several hours.  Feel clumsy and have poor balance for several hours.  Feel forgetful about what happened after the procedure.  Have poor judgment for several hours.  Feel nauseous or vomit.  Have a sore throat if you had a breathing tube during the procedure.  Follow these instructions at home: For at least 24 hours after the procedure:   Do not: ? Participate in activities in which you could fall or become injured. ? Drive. ? Use heavy machinery. ? Drink alcohol. ? Take sleeping pills or medicines that cause drowsiness. ? Make important decisions or sign legal documents. ? Take care of children on your own.  Rest. Eating and drinking  Follow the diet that is recommended by your health care provider.  If you vomit, drink water, juice, or soup when you can drink without vomiting.  Make sure you have little or no nausea before eating solid foods. General instructions  Have a responsible adult stay with you until you are awake and alert.  Take over-the-counter and prescription medicines only as told by your health care provider.  If you smoke, do not smoke without supervision.  Keep all follow-up visits as told by your health care provider. This is important. Contact a health care provider if:  You keep feeling nauseous or you keep vomiting.  You feel light-headed.  You develop a  rash.  You have a fever. Get help right away if:  You have trouble breathing. This information is not intended to replace advice given to you by your health care provider. Make sure you discuss any questions you have with your health care provider. Document Released: 08/19/2015 Document Revised: 12/19/2015 Document Reviewed: 08/19/2015 Elsevier Interactive Patient Education  [employer].

## 2016-10-30 NOTE — Progress Notes (Signed)
FIB 4, 1.28, Fibrosure showed F0, u/s with F2 and some F3 No evidence of cirrhosis.  Genotype 1a, low viral count, 37,000  Please start approval process for Harvoni for 8 weeks.  EGD as planned.

## 2016-11-03 ENCOUNTER — Encounter (HOSPITAL_COMMUNITY): Payer: Self-pay

## 2016-11-03 ENCOUNTER — Encounter (HOSPITAL_COMMUNITY)
Admission: RE | Admit: 2016-11-03 | Discharge: 2016-11-03 | Disposition: A | Discharge status: Home / Self Care | Payer: Self-pay | Source: Ambulatory Visit | Attending: Internal Medicine | Admitting: Internal Medicine

## 2016-11-04 ENCOUNTER — Telehealth: Payer: Self-pay

## 2016-11-04 NOTE — Telephone Encounter (Signed)
Endo Scheduler called this morning, pt was no show for his pre-op appt yesterday. Procedure is scheduled for 11/10/16. Tried to call pt, no answer, LMOVM for him to call office.

## 2016-11-06 NOTE — Telephone Encounter (Signed)
Endo Scheduler called and the hospital has been unable to contact pt for pre-op. EGD/ED with Propofol was canceled for 11/10/16 d/t no show for pre-op and unable to contact pt.   Routing to LSL as FYI.

## 2016-11-10 ENCOUNTER — Encounter (HOSPITAL_COMMUNITY): Admission: RE | Payer: Self-pay | Source: Ambulatory Visit

## 2016-11-10 ENCOUNTER — Ambulatory Visit (HOSPITAL_COMMUNITY): Admission: RE | Admit: 2016-11-10 | Payer: Self-pay | Source: Ambulatory Visit | Admitting: Internal Medicine

## 2016-11-10 SURGERY — ESOPHAGOGASTRODUODENOSCOPY (EGD) WITH PROPOFOL
Anesthesia: Monitor Anesthesia Care

## 2016-11-11 NOTE — Telephone Encounter (Signed)
Noted  

## 2016-11-11 NOTE — Telephone Encounter (Signed)
According to other epic notes (see result notes), patient is no longer staying at [facility] house and they do not know where he is. We have no emergency contact information.   FYI PCP.

## 2016-11-19 NOTE — Progress Notes (Signed)
Noted  

## 2016-11-19 NOTE — Progress Notes (Signed)
noted 

## 2017-01-06 ENCOUNTER — Ambulatory Visit: Payer: Self-pay | Admitting: Physician Assistant

## 2017-01-14 ENCOUNTER — Encounter: Payer: Self-pay | Admitting: Physician Assistant

## 2018-10-09 ENCOUNTER — Encounter (HOSPITAL_COMMUNITY): Payer: Self-pay | Admitting: *Deleted

## 2018-10-09 ENCOUNTER — Other Ambulatory Visit: Payer: Self-pay

## 2018-10-09 ENCOUNTER — Emergency Department (HOSPITAL_COMMUNITY)
Admission: EM | Admit: 2018-10-09 | Discharge: 2018-10-10 | Disposition: A | Discharge status: Other Acute Inpatient Hospital | Payer: Self-pay | Attending: Emergency Medicine | Admitting: Emergency Medicine

## 2018-10-09 DIAGNOSIS — F329 Major depressive disorder, single episode, unspecified: Secondary | ICD-10-CM | POA: Insufficient documentation

## 2018-10-09 DIAGNOSIS — Z79899 Other long term (current) drug therapy: Secondary | ICD-10-CM | POA: Insufficient documentation

## 2018-10-09 DIAGNOSIS — R1013 Epigastric pain: Secondary | ICD-10-CM | POA: Insufficient documentation

## 2018-10-09 DIAGNOSIS — Z046 Encounter for general psychiatric examination, requested by authority: Secondary | ICD-10-CM | POA: Insufficient documentation

## 2018-10-09 DIAGNOSIS — F17210 Nicotine dependence, cigarettes, uncomplicated: Secondary | ICD-10-CM | POA: Insufficient documentation

## 2018-10-09 DIAGNOSIS — Z1159 Encounter for screening for other viral diseases: Secondary | ICD-10-CM | POA: Insufficient documentation

## 2018-10-09 DIAGNOSIS — F22 Delusional disorders: Secondary | ICD-10-CM | POA: Insufficient documentation

## 2018-10-09 DIAGNOSIS — R45851 Suicidal ideations: Secondary | ICD-10-CM | POA: Insufficient documentation

## 2018-10-09 DIAGNOSIS — F1910 Other psychoactive substance abuse, uncomplicated: Secondary | ICD-10-CM | POA: Insufficient documentation

## 2018-10-09 DIAGNOSIS — R112 Nausea with vomiting, unspecified: Secondary | ICD-10-CM | POA: Insufficient documentation

## 2018-10-09 HISTORY — DX: Suicidal ideations: R45.851

## 2018-10-09 LAB — COMPREHENSIVE METABOLIC PANEL
ALT: 76 U/L — ABNORMAL HIGH (ref 0–44)
AST: 53 U/L — ABNORMAL HIGH (ref 15–41)
Albumin: 4 g/dL (ref 3.5–5.0)
Alkaline Phosphatase: 47 U/L (ref 38–126)
Anion gap: 10 (ref 5–15)
BUN: 10 mg/dL (ref 6–20)
CO2: 30 mmol/L (ref 22–32)
Calcium: 9.9 mg/dL (ref 8.9–10.3)
Chloride: 92 mmol/L — ABNORMAL LOW (ref 98–111)
Creatinine, Ser: 0.84 mg/dL (ref 0.61–1.24)
GFR calc Af Amer: >60 mL/min (ref >60)
GFR calc non Af Amer: >60 mL/min (ref >60)
Glucose, Bld: 110 mg/dL — ABNORMAL HIGH (ref 70–99)
Potassium: 3.4 mmol/L — ABNORMAL LOW (ref 3.5–5.1)
Sodium: 132 mmol/L — ABNORMAL LOW (ref 135–145)
Total Bilirubin: 0.9 mg/dL (ref 0.3–1.2)
Total Protein: 6.7 g/dL (ref 6.5–8.1)

## 2018-10-09 LAB — CBC WITH DIFFERENTIAL/PLATELET
Abs Immature Granulocytes: 0.02 10*3/uL (ref 0.00–0.07)
Basophils Absolute: 0 10*3/uL (ref 0.0–0.1)
Basophils Relative: 1 %
Eosinophils Absolute: 0.2 10*3/uL (ref 0.0–0.5)
Eosinophils Relative: 3 %
HCT: 34.6 % — ABNORMAL LOW (ref 39.0–52.0)
Hemoglobin: 11.5 g/dL — ABNORMAL LOW (ref 13.0–17.0)
Immature Granulocytes: 0 %
Lymphocytes Relative: 32 %
Lymphs Abs: 2 10*3/uL (ref 0.7–4.0)
MCH: 28.7 pg (ref 26.0–34.0)
MCHC: 33.2 g/dL (ref 30.0–36.0)
MCV: 86.3 fL (ref 80.0–100.0)
Monocytes Absolute: 0.5 10*3/uL (ref 0.1–1.0)
Monocytes Relative: 9 %
Neutro Abs: 3.5 10*3/uL (ref 1.7–7.7)
Neutrophils Relative %: 55 %
Platelets: 214 10*3/uL (ref 150–400)
RBC: 4.01 MIL/uL — ABNORMAL LOW (ref 4.22–5.81)
RDW: 11.9 % (ref 11.5–15.5)
WBC: 6.3 10*3/uL (ref 4.0–10.5)
nRBC: 0 % (ref 0.0–0.2)

## 2018-10-09 LAB — ACETAMINOPHEN LEVEL: Acetaminophen (Tylenol), Serum: <10 ug/mL — ABNORMAL LOW (ref 10–30)

## 2018-10-09 LAB — RAPID URINE DRUG SCREEN, HOSP PERFORMED
Amphetamines: NOT DETECTED
Barbiturates: NOT DETECTED
Benzodiazepines: NOT DETECTED
Cocaine: POSITIVE — AB
Opiates: POSITIVE — AB
Tetrahydrocannabinol: NOT DETECTED

## 2018-10-09 LAB — URINALYSIS, ROUTINE W REFLEX MICROSCOPIC
Bilirubin Urine: NEGATIVE
Glucose, UA: NEGATIVE mg/dL
Hgb urine dipstick: NEGATIVE
Ketones, ur: 5 mg/dL — AB
Leukocytes,Ua: NEGATIVE
Nitrite: NEGATIVE
Protein, ur: NEGATIVE mg/dL
Specific Gravity, Urine: 1.009 (ref 1.005–1.030)
pH: 6 (ref 5.0–8.0)

## 2018-10-09 LAB — LIPASE, BLOOD: Lipase: 41 U/L (ref 11–51)

## 2018-10-09 LAB — SALICYLATE LEVEL: Salicylate Lvl: <7 mg/dL (ref 2.8–30.0)

## 2018-10-09 MED ORDER — IBUPROFEN 400 MG PO TABS
600.0000 mg | ORAL_TABLET | Freq: Three times a day (TID) | ORAL | Status: DC | PRN
  Administered 2018-10-10: 600 mg via ORAL
  Filled 2018-10-09: qty 1

## 2018-10-09 MED ORDER — LORAZEPAM 1 MG PO TABS: 0.0000 mg | ORAL_TABLET | Freq: Two times a day (BID) | ORAL | Status: DC

## 2018-10-09 MED ORDER — ALUM & MAG HYDROXIDE-SIMETH 200-200-20 MG/5ML PO SUSP: 30.0000 mL | Freq: Four times a day (QID) | ORAL | Status: DC | PRN

## 2018-10-09 MED ORDER — VITAMIN B-1 100 MG PO TABS
100.0000 mg | ORAL_TABLET | Freq: Every day | ORAL | Status: DC
  Administered 2018-10-10: 10:00:00 100 mg via ORAL
  Filled 2018-10-09: qty 1

## 2018-10-09 MED ORDER — ALUM & MAG HYDROXIDE-SIMETH 200-200-20 MG/5ML PO SUSP
30.0000 mL | Freq: Once | ORAL | Status: AC
  Administered 2018-10-10: 30 mL via ORAL
  Filled 2018-10-09: qty 30

## 2018-10-09 MED ORDER — LORAZEPAM 2 MG/ML IJ SOLN: 0.0000 mg | Freq: Four times a day (QID) | INTRAMUSCULAR | Status: DC

## 2018-10-09 MED ORDER — THIAMINE HCL 100 MG/ML IJ SOLN: 100.0000 mg | Freq: Every day | INTRAMUSCULAR | Status: DC

## 2018-10-09 MED ORDER — ONDANSETRON HCL 4 MG PO TABS
4.0000 mg | ORAL_TABLET | Freq: Three times a day (TID) | ORAL | Status: DC | PRN
  Administered 2018-10-10: 4 mg via ORAL
  Filled 2018-10-09: qty 1

## 2018-10-09 MED ORDER — LORAZEPAM 1 MG PO TABS: 0.0000 mg | ORAL_TABLET | Freq: Four times a day (QID) | ORAL | Status: DC

## 2018-10-09 MED ORDER — LORAZEPAM 2 MG/ML IJ SOLN: 0.0000 mg | Freq: Two times a day (BID) | INTRAMUSCULAR | Status: DC

## 2018-10-09 NOTE — ED Notes (Signed)
Pt sleeeping in between questions asked

## 2018-10-09 NOTE — ED Notes (Signed)
Pts large clear trash bag of belongings was placed under sink in locker room.

## 2018-10-09 NOTE — ED Notes (Signed)
tts has recommended inpatient  Admission  No beds there

## 2018-10-09 NOTE — ED Notes (Signed)
The pt continues to sleep unless disturbed. Easily awakened

## 2018-10-09 NOTE — ED Notes (Signed)
Pt just received tts

## 2018-10-09 NOTE — ED Notes (Signed)
Pt changed into purple scrubs and wonded by security. Belongings and cords removed from room.

## 2018-10-09 NOTE — ED Notes (Signed)
Pt provided urine sample

## 2018-10-09 NOTE — ED Notes (Signed)
The pt reports that he felt like killing someone today

## 2018-10-09 NOTE — BH Assessment (Signed)
Faxed clinical information to the following facilities for placement:  [facility]   [facility]   [facility]   [facility]   [facility]   [facility]   [facility]   [facility]    [address] Patsy [place], [facility], [facility], [facility] Triage Specialist [phone]

## 2018-10-09 NOTE — ED Provider Notes (Signed)
[facility] EMERGENCY DEPARTMENT Provider Note   CSN: [id number] Arrival date & time: 10/09/18  1817    History   Chief Complaint Chief Complaint  Patient presents with  . Psychiatric Evaluation    HPI Marv Alfrey is a 43 y.o. male.     43 yo M with a chief complaint of suicidal ideation.  The patient states that he has been having some delusions that people are coming to get him and also feels that he is very depressed and that the road be better without him.  He obtained some heroin and tried to overdose on it but a friend of his found him and then encouraged him to come here for evaluation.  He has tried to commit suicide in the past.  Denies any specific event that made him feel this way.  States that he is compliant with his medications.  Had some nausea and vomiting yesterday.  Denies any abdominal pain denies chest pain or shortness of breath denies fevers or chills.     Past Medical History:  Diagnosis Date  . Anxiety   . Bipolar disorder (HCC)   . Depression   . Kidney stone   . Mental health disorder   . Myocardial infarction [facility]) 2006   stress/anxiety. cath done no damage. no stents.   . Pancreatitis    three hospitalizations, per patient secondary to etoh  . Polysubstance abuse [facility])     Patient Active Problem List   Diagnosis Date Noted  . Esophageal dysphagia 10/08/2016  . Odynophagia 10/08/2016  . Chronic hepatitis C (HCC) 10/08/2016  . Bipolar I disorder, current or most recent episode depressed, with psychotic features (HCC) 07/22/2016  . Opioid withdrawal (HCC) 07/22/2016  . Opioid use disorder, severe, dependence (HCC) 07/22/2016  . Tobacco use disorder 07/22/2016  . Sedative, hypnotic or anxiolytic use disorder, severe, dependence (HCC) 07/22/2016    Past Surgical History:  Procedure Laterality Date  . CARDIAC CATHETERIZATION  11/05/2005   normal coronary arteries with EF 50% ([facility])  . COLONOSCOPY    . ESOPHAGEAL  DILATION          Home Medications    Prior to Admission medications   Medication Sig Start Date End Date Taking? Authorizing Provider  esomeprazole (NEXIUM) 20 MG capsule Take 1 capsule (20 mg total) by mouth daily before breakfast. 10/08/16   Tiffany Kocher, PA-C  FLUoxetine (PROZAC) 20 MG capsule Take 1 capsule (20 mg total) by mouth at bedtime. 07/25/16   Pucilowska, Braulio Conte B, MD  traZODone (DESYREL) 100 MG tablet Take 100 mg by mouth at bedtime.    [provider]    Family History Family History  Problem Relation Age of Onset  . Cancer Mother        breast  . Cancer Father        lymphoma    Social History Social History   Tobacco Use  . Smoking status: Current Every Day Smoker    Packs/day: 0.50    Types: Cigarettes  . Smokeless tobacco: Never Used  Substance Use Topics  . Alcohol use: No    Comment: history heavy etoh in his 55's, quit at age 69  . Drug use: No    Types: Heroin, Cocaine    Comment: last use 05/2016     Allergies   Tilapia [fish allergy] and Tramadol   Review of Systems Review of Systems  Constitutional: Negative for chills and fever.  HENT: Negative for congestion and facial  swelling.   Eyes: Negative for discharge and visual disturbance.  Respiratory: Negative for shortness of breath.   Cardiovascular: Negative for chest pain and palpitations.  Gastrointestinal: Positive for nausea and vomiting. Negative for abdominal pain and diarrhea.  Musculoskeletal: Negative for arthralgias and myalgias.  Skin: Negative for color change and rash.  Neurological: Negative for tremors, syncope and headaches.  Psychiatric/Behavioral: Negative for confusion and dysphoric mood.     Physical Exam Updated Vital Signs BP 117/70 (BP Location: Right Arm)   Pulse 92   Temp 98.2 F (36.8 C) (Oral)   Resp 18   Ht 5\' 9"  (1.753 m)   Wt 70.3 kg   SpO2 96%   BMI 22.89 kg/m   Physical Exam Vitals signs and nursing note reviewed.   Constitutional:      Appearance: He is well-developed.  HENT:     Head: Normocephalic and atraumatic.  Eyes:     Pupils: Pupils are equal, round, and reactive to light.  Neck:     Musculoskeletal: Normal range of motion and neck supple.     Vascular: No JVD.  Cardiovascular:     Rate and Rhythm: Normal rate and regular rhythm.     Heart sounds: No murmur. No friction rub. No gallop.   Pulmonary:     Effort: No respiratory distress.     Breath sounds: No wheezing.  Abdominal:     General: There is no distension.     Tenderness: There is abdominal tenderness (mild epigastric). There is no guarding or rebound.  Musculoskeletal: Normal range of motion.  Skin:    Coloration: Skin is not pale.     Findings: No rash.  Neurological:     Mental Status: He is alert and oriented to person, place, and time.  Psychiatric:        Behavior: Behavior normal.      ED Treatments / Results  Labs (all labs ordered are listed, but only abnormal results are displayed) Labs Reviewed  CBC WITH DIFFERENTIAL/PLATELET - Abnormal; Notable for the following components:      Result Value   RBC 4.01 (*)    Hemoglobin 11.5 (*)    HCT 34.6 (*)    All other components within normal limits  COMPREHENSIVE METABOLIC PANEL - Abnormal; Notable for the following components:   Sodium 132 (*)    Potassium 3.4 (*)    Chloride 92 (*)    Glucose, Bld 110 (*)    AST 53 (*)    ALT 76 (*)    All other components within normal limits  RAPID URINE DRUG SCREEN, HOSP PERFORMED - Abnormal; Notable for the following components:   Opiates POSITIVE (*)    Cocaine POSITIVE (*)    All other components within normal limits  URINALYSIS, ROUTINE W REFLEX MICROSCOPIC - Abnormal; Notable for the following components:   Ketones, ur 5 (*)    All other components within normal limits  ACETAMINOPHEN LEVEL - Abnormal; Notable for the following components:   Acetaminophen (Tylenol), Serum <10 (*)    All other components within  normal limits  LIPASE, BLOOD  SALICYLATE LEVEL    EKG None  Radiology No results found.  Procedures Procedures (including critical care time)  Medications Ordered in ED Medications  alum & mag hydroxide-simeth (MAALOX/MYLANTA) 200-200-20 MG/5ML suspension 30 mL (has no administration in time range)  LORazepam (ATIVAN) injection 0-4 mg (has no administration in time range)    Or  LORazepam (ATIVAN) tablet 0-4 mg (has no  administration in time range)  LORazepam (ATIVAN) injection 0-4 mg (has no administration in time range)    Or  LORazepam (ATIVAN) tablet 0-4 mg (has no administration in time range)  thiamine (VITAMIN B-1) tablet 100 mg (has no administration in time range)    Or  thiamine (B-1) injection 100 mg (has no administration in time range)  alum & mag hydroxide-simeth (MAALOX/MYLANTA) 200-200-20 MG/5ML suspension 30 mL (has no administration in time range)  ondansetron (ZOFRAN) tablet 4 mg (has no administration in time range)  ibuprofen (ADVIL) tablet 600 mg (has no administration in time range)     Initial Impression / Assessment and Plan / ED Course  I have reviewed the triage vital signs and the nursing notes.  Pertinent labs & imaging results that were available during my care of the patient were reviewed by me and considered in my medical decision making (see chart for details).        43 yo M with a chief complaint of suicidal ideation.  Patient had some mild nausea and vomiting yesterday but no other medical complaints.  Able to eat today.  We will give a GI cocktail and check CBC CMP and lipase.  Mild epigastric tenderness.  I feel he is likely medically clear.  TTS evaluation.  LFTs and lipase are unremarkable.  Awaiting TTS assessment.  The patients results and plan were reviewed and discussed.   Any x-rays performed were independently reviewed by myself.   Differential diagnosis were considered with the presenting HPI.  Medications  alum & mag  hydroxide-simeth (MAALOX/MYLANTA) 200-200-20 MG/5ML suspension 30 mL (has no administration in time range)  LORazepam (ATIVAN) injection 0-4 mg (has no administration in time range)    Or  LORazepam (ATIVAN) tablet 0-4 mg (has no administration in time range)  LORazepam (ATIVAN) injection 0-4 mg (has no administration in time range)    Or  LORazepam (ATIVAN) tablet 0-4 mg (has no administration in time range)  thiamine (VITAMIN B-1) tablet 100 mg (has no administration in time range)    Or  thiamine (B-1) injection 100 mg (has no administration in time range)  alum & mag hydroxide-simeth (MAALOX/MYLANTA) 200-200-20 MG/5ML suspension 30 mL (has no administration in time range)  ondansetron (ZOFRAN) tablet 4 mg (has no administration in time range)  ibuprofen (ADVIL) tablet 600 mg (has no administration in time range)    Vitals:   10/09/18 1838 10/09/18 1906  BP: 117/70   Pulse: 92   Resp: 18   Temp: 98.2 F (36.8 C)   TempSrc: Oral   SpO2: 96%   Weight: 70.3 kg 70.3 kg  Height: 5\' 9"  (1.753 m) 5\' 9"  (1.753 m)    Final diagnoses:  Suicidal ideation  Polysubstance abuse (HCC)    Admission/ observation were discussed with the admitting physician, patient and/or family and they are comfortable with the plan.    Final Clinical Impressions(s) / ED Diagnoses   Final diagnoses:  Suicidal ideation  Polysubstance abuse [facility])    ED Discharge Orders    None       Melene Plan, DO 10/09/18 2013

## 2018-10-09 NOTE — ED Triage Notes (Signed)
The pt reports that he has been  dooung drugs and alcohol since he was 43 yrs old  He reports that he was trying to overdose on heroin this am to kill himsle  He last did cocaine and heroin this day  He last had alcohol  yesterday
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
## 2018-10-10 ENCOUNTER — Encounter (HOSPITAL_COMMUNITY): Payer: Self-pay | Admitting: Emergency Medicine

## 2018-10-10 ENCOUNTER — Inpatient Hospital Stay (HOSPITAL_COMMUNITY)
Admission: AD | Admit: 2018-10-10 | Discharge: 2018-10-14 | DRG: 885 | Disposition: A | Discharge status: Home / Self Care | Payer: No Typology Code available for payment source | Source: Intra-hospital | Attending: Psychiatry | Admitting: Psychiatry

## 2018-10-10 ENCOUNTER — Other Ambulatory Visit: Payer: Self-pay

## 2018-10-10 DIAGNOSIS — Z23 Encounter for immunization: Secondary | ICD-10-CM

## 2018-10-10 DIAGNOSIS — F1420 Cocaine dependence, uncomplicated: Secondary | ICD-10-CM | POA: Diagnosis present

## 2018-10-10 DIAGNOSIS — F1020 Alcohol dependence, uncomplicated: Secondary | ICD-10-CM | POA: Diagnosis present

## 2018-10-10 DIAGNOSIS — T404X2A Poisoning by other synthetic narcotics, intentional self-harm, initial encounter: Secondary | ICD-10-CM | POA: Diagnosis present

## 2018-10-10 DIAGNOSIS — F419 Anxiety disorder, unspecified: Secondary | ICD-10-CM | POA: Diagnosis present

## 2018-10-10 DIAGNOSIS — F17210 Nicotine dependence, cigarettes, uncomplicated: Secondary | ICD-10-CM | POA: Diagnosis present

## 2018-10-10 DIAGNOSIS — F1124 Opioid dependence with opioid-induced mood disorder: Secondary | ICD-10-CM

## 2018-10-10 DIAGNOSIS — Z1159 Encounter for screening for other viral diseases: Secondary | ICD-10-CM

## 2018-10-10 DIAGNOSIS — F1123 Opioid dependence with withdrawal: Secondary | ICD-10-CM | POA: Diagnosis present

## 2018-10-10 DIAGNOSIS — Y900 Blood alcohol level of less than 20 mg/100 ml: Secondary | ICD-10-CM | POA: Diagnosis present

## 2018-10-10 DIAGNOSIS — Z590 Homelessness: Secondary | ICD-10-CM

## 2018-10-10 DIAGNOSIS — F3130 Bipolar disorder, current episode depressed, mild or moderate severity, unspecified: Principal | ICD-10-CM | POA: Diagnosis present

## 2018-10-10 DIAGNOSIS — G4700 Insomnia, unspecified: Secondary | ICD-10-CM | POA: Diagnosis present

## 2018-10-10 LAB — SARS CORONAVIRUS 2 BY RT PCR (HOSPITAL ORDER, PERFORMED IN ~~LOC~~ HOSPITAL LAB): SARS Coronavirus 2: NEGATIVE

## 2018-10-10 MED ORDER — ALUM & MAG HYDROXIDE-SIMETH 200-200-20 MG/5ML PO SUSP: 30.0000 mL | ORAL | Status: DC | PRN

## 2018-10-10 MED ORDER — LORAZEPAM 1 MG PO TABS
1.0000 mg | ORAL_TABLET | Freq: Four times a day (QID) | ORAL | Status: DC | PRN
  Administered 2018-10-10 – 2018-10-12 (×4): 1 mg via ORAL
  Filled 2018-10-10 (×3): qty 1

## 2018-10-10 MED ORDER — MAGNESIUM HYDROXIDE 400 MG/5ML PO SUSP: 30.0000 mL | Freq: Every day | ORAL | Status: DC | PRN

## 2018-10-10 MED ORDER — PRAZOSIN HCL 1 MG PO CAPS
1.0000 mg | ORAL_CAPSULE | Freq: Every day | ORAL | Status: DC
  Administered 2018-10-10: 21:00:00 1 mg via ORAL
  Filled 2018-10-10 (×4): qty 1

## 2018-10-10 MED ORDER — ACETAMINOPHEN 325 MG PO TABS: 650.0000 mg | ORAL_TABLET | Freq: Four times a day (QID) | ORAL | Status: DC | PRN

## 2018-10-10 MED ORDER — ONDANSETRON 4 MG PO TBDP
4.0000 mg | ORAL_TABLET | Freq: Four times a day (QID) | ORAL | Status: DC | PRN
  Administered 2018-10-10: 4 mg via ORAL
  Filled 2018-10-10: qty 1

## 2018-10-10 MED ORDER — HYDROXYZINE HCL 25 MG PO TABS
25.0000 mg | ORAL_TABLET | Freq: Three times a day (TID) | ORAL | Status: DC | PRN
  Administered 2018-10-10 – 2018-10-11 (×2): 25 mg via ORAL
  Filled 2018-10-10: qty 1

## 2018-10-10 MED ORDER — QUETIAPINE FUMARATE 50 MG PO TABS
50.0000 mg | ORAL_TABLET | Freq: Every day | ORAL | Status: DC
  Administered 2018-10-10: 50 mg via ORAL
  Filled 2018-10-10 (×4): qty 1

## 2018-10-10 MED ORDER — QUETIAPINE FUMARATE 25 MG PO TABS
25.0000 mg | ORAL_TABLET | Freq: Two times a day (BID) | ORAL | Status: DC
  Administered 2018-10-10 – 2018-10-11 (×2): 25 mg via ORAL
  Filled 2018-10-10 (×7): qty 1

## 2018-10-10 MED ORDER — HYDROXYZINE HCL 25 MG PO TABS: 25.0000 mg | ORAL_TABLET | Freq: Four times a day (QID) | ORAL | Status: DC | PRN

## 2018-10-10 MED ORDER — THIAMINE HCL 100 MG/ML IJ SOLN: 100.0000 mg | Freq: Once | INTRAMUSCULAR | Status: DC

## 2018-10-10 MED ORDER — LOPERAMIDE HCL 2 MG PO CAPS: 2.0000 mg | ORAL_CAPSULE | ORAL | Status: DC | PRN

## 2018-10-10 MED ORDER — PNEUMOCOCCAL VAC POLYVALENT 25 MCG/0.5ML IJ INJ
0.5000 mL | INJECTION | INTRAMUSCULAR | Status: AC
  Administered 2018-10-11: 0.5 mL via INTRAMUSCULAR

## 2018-10-10 MED ORDER — TRAZODONE HCL 50 MG PO TABS
50.0000 mg | ORAL_TABLET | Freq: Every evening | ORAL | Status: DC | PRN
  Filled 2018-10-10: qty 1

## 2018-10-10 MED ORDER — ADULT MULTIVITAMIN W/MINERALS CH
1.0000 | ORAL_TABLET | Freq: Every day | ORAL | Status: DC
  Administered 2018-10-10 – 2018-10-13 (×4): 1 via ORAL
  Filled 2018-10-10 (×7): qty 1

## 2018-10-10 MED ORDER — VITAMIN B-1 100 MG PO TABS
100.0000 mg | ORAL_TABLET | Freq: Every day | ORAL | Status: DC
  Administered 2018-10-11 – 2018-10-13 (×3): 100 mg via ORAL
  Filled 2018-10-10 (×5): qty 1

## 2018-10-10 NOTE — Progress Notes (Signed)
Patient did not attend wrap up group because he was asleep. 

## 2018-10-10 NOTE — ED Notes (Signed)
Breakfast ordered 

## 2018-10-10 NOTE — ED Notes (Signed)
Hand off report given to Ethelene Browns RN at [facility], if pt SARS is negative pt can be transported at 12. If positive please call number on chart for update.

## 2018-10-10 NOTE — Tx Team (Signed)
Initial Treatment Plan 10/10/2018 8:22 PM Steve Davenport SPQ:[id number]    PATIENT STRESSORS: Financial difficulties Substance abuse Traumatic event   PATIENT STRENGTHS: Ability for insight Motivation for treatment/growth Supportive family/friends   PATIENT IDENTIFIED PROBLEMS: "Anxiety"  "Panic Attacks"  "I have trouble sleeping"                 DISCHARGE CRITERIA:  Improved stabilization in mood, thinking, and/or behavior Reduction of life-threatening or endangering symptoms to within safe limits Verbal commitment to aftercare and medication compliance Withdrawal symptoms are absent or subacute and managed without 24-hour nursing intervention  PRELIMINARY DISCHARGE PLAN: Outpatient therapy Return to previous living arrangement  PATIENT/FAMILY INVOLVEMENT: This treatment plan has been presented to and reviewed with the patient, Steve Davenport, and/or family member.  The patient and family have been given the opportunity to ask questions and make suggestions.  Tania Ade, RN 10/10/2018, 8:22 PM

## 2018-10-10 NOTE — Progress Notes (Signed)
Patient ID: Steve Davenport, male   DOB: 1975-10-20, 43 y.o.   MRN: [id number]  [place] NOVEL CORONAVIRUS (COVID-19) DAILY CHECK-OFF SYMPTOMS - answer yes or no to each - every day NO YES  Have you had a fever in the past 24 hours?  . Fever (Temp > 37.80C / 100F) X   Have you had any of these symptoms in the past 24 hours? . New Cough .  Sore Throat  .  Shortness of Breath .  Difficulty Breathing .  Unexplained Body Aches   X   Have you had any one of these symptoms in the past 24 hours not related to allergies?   . Runny Nose .  Nasal Congestion .  Sneezing   X   If you have had runny nose, nasal congestion, sneezing in the past 24 hours, has it worsened?  X   EXPOSURES - check yes or no X   Have you traveled outside the state in the past 14 days?  X   Have you been in contact with someone with a confirmed diagnosis of COVID-19 or PUI in the past 14 days without wearing appropriate PPE?  X   Have you been living in the same home as a person with confirmed diagnosis of COVID-19 or a PUI (household contact)?    X   Have you been diagnosed with COVID-19?    X              What to do next: Answered NO to all: Answered YES to anything:   Proceed with unit schedule Follow the [facility] Inpatient Flowsheet.
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
## 2018-10-10 NOTE — Progress Notes (Signed)
Per Lillia Abed, RN, [facility], pt has been accepted to [facility] bed 400-2. Accepting provider is Nira Conn, NP. Attending provider is Dr. Jama Flavors, MD. Patient can arrive by 12 noon (once COVID tested). Number for report is [phone]. AC spoke with Viviann Spare, RN regarding disposition.    Vilma Meckel. Algis Greenhouse, MSW, LCSW Clinical Social Work/Disposition Phone: [phone] Fax: [phone]

## 2018-10-10 NOTE — ED Notes (Signed)
Pt signed voluntary admit form for [facility].  Pt declined to make phone calls at this time.

## 2018-10-11 DIAGNOSIS — F1124 Opioid dependence with opioid-induced mood disorder: Secondary | ICD-10-CM

## 2018-10-11 MED ORDER — DULOXETINE HCL 20 MG PO CPEP
20.0000 mg | ORAL_CAPSULE | Freq: Every day | ORAL | Status: DC
  Administered 2018-10-11 – 2018-10-13 (×3): 20 mg via ORAL
  Filled 2018-10-11 (×3): qty 1
  Filled 2018-10-11: qty 7
  Filled 2018-10-11 (×2): qty 1

## 2018-10-11 MED ORDER — METHOCARBAMOL 500 MG PO TABS
500.0000 mg | ORAL_TABLET | Freq: Three times a day (TID) | ORAL | Status: DC | PRN
  Administered 2018-10-12 (×2): 500 mg via ORAL
  Filled 2018-10-11 (×2): qty 1

## 2018-10-11 MED ORDER — LOPERAMIDE HCL 2 MG PO CAPS: 2.0000 mg | ORAL_CAPSULE | ORAL | Status: DC | PRN

## 2018-10-11 MED ORDER — NICOTINE 21 MG/24HR TD PT24
21.0000 mg | MEDICATED_PATCH | Freq: Every day | TRANSDERMAL | Status: DC
  Administered 2018-10-11 – 2018-10-13 (×3): 21 mg via TRANSDERMAL
  Filled 2018-10-11: qty 1
  Filled 2018-10-11: qty 7
  Filled 2018-10-11 (×4): qty 1

## 2018-10-11 MED ORDER — CLONIDINE HCL 0.1 MG PO TABS: 0.1000 mg | ORAL_TABLET | Freq: Every day | ORAL | Status: DC

## 2018-10-11 MED ORDER — CLONIDINE HCL 0.1 MG PO TABS
0.1000 mg | ORAL_TABLET | ORAL | Status: DC
  Filled 2018-10-11 (×2): qty 1

## 2018-10-11 MED ORDER — HYDROXYZINE HCL 25 MG PO TABS
25.0000 mg | ORAL_TABLET | Freq: Four times a day (QID) | ORAL | Status: DC | PRN
  Administered 2018-10-12: 25 mg via ORAL
  Filled 2018-10-11: qty 1

## 2018-10-11 MED ORDER — QUETIAPINE FUMARATE 25 MG PO TABS
25.0000 mg | ORAL_TABLET | Freq: Every day | ORAL | Status: DC
  Administered 2018-10-11: 21:00:00 25 mg via ORAL
  Filled 2018-10-11 (×3): qty 1

## 2018-10-11 MED ORDER — ONDANSETRON 4 MG PO TBDP
4.0000 mg | ORAL_TABLET | Freq: Four times a day (QID) | ORAL | Status: DC | PRN
  Administered 2018-10-12 – 2018-10-13 (×2): 4 mg via ORAL
  Filled 2018-10-11 (×2): qty 1

## 2018-10-11 MED ORDER — DICYCLOMINE HCL 20 MG PO TABS: 20.0000 mg | ORAL_TABLET | Freq: Four times a day (QID) | ORAL | Status: DC | PRN

## 2018-10-11 MED ORDER — CLONIDINE HCL 0.1 MG PO TABS
0.1000 mg | ORAL_TABLET | Freq: Four times a day (QID) | ORAL | Status: DC
  Administered 2018-10-11 – 2018-10-12 (×6): 0.1 mg via ORAL
  Filled 2018-10-11 (×9): qty 1

## 2018-10-11 MED ORDER — NAPROXEN 500 MG PO TABS
500.0000 mg | ORAL_TABLET | Freq: Two times a day (BID) | ORAL | Status: DC | PRN
  Administered 2018-10-12: 500 mg via ORAL
  Filled 2018-10-11: qty 1

## 2018-10-11 MED ORDER — TRAZODONE HCL 50 MG PO TABS
25.0000 mg | ORAL_TABLET | Freq: Every evening | ORAL | Status: DC | PRN
  Filled 2018-10-11: qty 7
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
## 2018-10-11 NOTE — Plan of Care (Signed)
  Problem: Education: Goal: Knowledge of [facility] Education information/materials will improve Outcome: Progressing   Problem: Activity: Goal: Interest or engagement in activities will improve Outcome: Progressing   Problem: Health Behavior/Discharge Planning: Goal: Compliance with treatment plan for underlying cause of condition will improve Outcome: Progressing   Problem: Physical Regulation: Goal: Ability to maintain clinical measurements within normal limits will improve Outcome: Progressing   Problem: Safety: Goal: Periods of time without injury will increase Outcome: Progressing   

## 2018-10-11 NOTE — Progress Notes (Signed)
D.  Pt pleasant but blunted on approach, no complaints voiced at this time.  PT is a new admission to unit, did not feel well enough to attend evening wrap up group.  Pt denies SI/HI/AVH at this time.  A.  Support and encouragement offered, medication given as ordered.

## 2018-10-11 NOTE — Tx Team (Signed)
Interdisciplinary Treatment and Diagnostic Plan Update  10/11/2018 Time of Session: 9:00am Steve Davenport MRN: [id number]  Principal Diagnosis: <principal problem not specified>  Secondary Diagnoses: Active Problems:   Bipolar I disorder, most recent episode depressed ([place])   Current Medications:  Current Facility-Administered Medications  Medication Dose Route Frequency Provider Last Rate Last Dose  . acetaminophen (TYLENOL) tablet 650 mg  650 mg Oral Q6H PRN Money, Lowry Ram, FNP      . alum & mag hydroxide-simeth (MAALOX/MYLANTA) 200-200-20 MG/5ML suspension 30 mL  30 mL Oral Q4H PRN Money, Lowry Ram, FNP      . hydrOXYzine (ATARAX/VISTARIL) tablet 25 mg  25 mg Oral TID PRN Money, Lowry Ram, FNP   25 mg at 10/11/18 0826  . loperamide (IMODIUM) capsule 2-4 mg  2-4 mg Oral PRN Money, Lowry Ram, FNP      . LORazepam (ATIVAN) tablet 1 mg  1 mg Oral Q6H PRN Money, Lowry Ram, FNP   1 mg at 10/10/18 2130  . magnesium hydroxide (MILK OF MAGNESIA) suspension 30 mL  30 mL Oral Daily PRN Money, Lowry Ram, FNP      . multivitamin with minerals tablet 1 tablet  1 tablet Oral Daily Money, Lowry Ram, FNP   1 tablet at 10/11/18 7062  . nicotine (NICODERM CQ - dosed in mg/24 hours) patch 21 mg  21 mg Transdermal Daily Cobos, Myer Peer, MD   21 mg at 10/11/18 0820  . ondansetron (ZOFRAN-ODT) disintegrating tablet 4 mg  4 mg Oral Q6H PRN Money, Lowry Ram, FNP   4 mg at 10/10/18 1804  . pneumococcal 23 valent vaccine (PNU-IMMUNE) injection 0.5 mL  0.5 mL Intramuscular Tomorrow-1000 Cobos, Fernando A, MD      . prazosin (MINIPRESS) capsule 1 mg  1 mg Oral QHS Money, Lowry Ram, FNP   1 mg at 10/10/18 2101  . QUEtiapine (SEROQUEL) tablet 25 mg  25 mg Oral BID Money, Lowry Ram, FNP   25 mg at 10/11/18 0755  . QUEtiapine (SEROQUEL) tablet 50 mg  50 mg Oral QHS Money, Lowry Ram, FNP   50 mg at 10/10/18 2102  . thiamine (B-1) injection 100 mg  100 mg Intramuscular Once Money, Darnelle Maffucci B, FNP      . thiamine (VITAMIN B-1)  tablet 100 mg  100 mg Oral Daily Money, Lowry Ram, FNP   100 mg at 10/11/18 3762  . traZODone (DESYREL) tablet 50 mg  50 mg Oral QHS PRN Money, Lowry Ram, FNP       PTA Medications: Medications Prior to Admission  Medication Sig Dispense Refill Last Dose  . Aspirin-Acetaminophen-Caffeine (GOODYS EXTRA STRENGTH) 520-260-32.5 MG PACK Take 1 Package by mouth as needed (pain).   10/08/2018  . esomeprazole (NEXIUM) 20 MG capsule Take 1 capsule (20 mg total) by mouth daily before breakfast. 20 capsule 0   . FLUoxetine (PROZAC) 20 MG capsule Take 1 capsule (20 mg total) by mouth at bedtime. (Patient not taking: Reported on 10/10/2018) 30 capsule 1 Not Taking at Unknown time  . mirtazapine (REMERON) 15 MG tablet Take 15 mg by mouth at bedtime.   Past Week at Unknown time  . traZODone (DESYREL) 100 MG tablet Take 100 mg by mouth at bedtime.   Past Month at Unknown time    Patient Stressors: Financial difficulties Substance abuse Traumatic event  Patient Strengths: Ability for insight Motivation for treatment/growth Supportive family/friends  Treatment Modalities: Medication Management, Group therapy, Case management,  1 to 1 session with clinician, Psychoeducation,  Recreational therapy.   Physician Treatment Plan for Primary Diagnosis: <principal problem not specified> Long Term Goal(s):     Short Term Goals:    Medication Management: Evaluate patient's response, side effects, and tolerance of medication regimen.  Therapeutic Interventions: 1 to 1 sessions, Unit Group sessions and Medication administration.  Evaluation of Outcomes: Not Met  Physician Treatment Plan for Secondary Diagnosis: Active Problems:   Bipolar I disorder, most recent episode depressed ([place])  Long Term Goal(s):     Short Term Goals:       Medication Management: Evaluate patient's response, side effects, and tolerance of medication regimen.  Therapeutic Interventions: 1 to 1 sessions, Unit Group sessions and  Medication administration.  Evaluation of Outcomes: Not Met   RN Treatment Plan for Primary Diagnosis: <principal problem not specified> Long Term Goal(s): Knowledge of disease and therapeutic regimen to maintain health will improve  Short Term Goals: Ability to demonstrate self-control, Ability to identify and develop effective coping behaviors will improve and Compliance with prescribed medications will improve  Medication Management: RN will administer medications as ordered by provider, will assess and evaluate patient's response and provide education to patient for prescribed medication. RN will report any adverse and/or side effects to prescribing provider.  Therapeutic Interventions: 1 on 1 counseling sessions, Psychoeducation, Medication administration, Evaluate responses to treatment, Monitor vital signs and CBGs as ordered, Perform/monitor CIWA, COWS, AIMS and Fall Risk screenings as ordered, Perform wound care treatments as ordered.  Evaluation of Outcomes: Not Met   LCSW Treatment Plan for Primary Diagnosis: <principal problem not specified> Long Term Goal(s): Safe transition to appropriate next level of care at discharge, Engage patient in therapeutic group addressing interpersonal concerns.  Short Term Goals: Engage patient in aftercare planning with referrals and resources, Increase social support, Identify triggers associated with mental health/substance abuse issues and Increase skills for wellness and recovery  Therapeutic Interventions: Assess for all discharge needs, 1 to 1 time with Social worker, Explore available resources and support systems, Assess for adequacy in community support network, Educate family and significant other(s) on suicide prevention, Complete Psychosocial Assessment, Interpersonal group therapy.  Evaluation of Outcomes: Not Met   Progress in Treatment: Attending groups: No. New to unit.  Participating in groups: No. Taking medication as  prescribed: Yes. Toleration medication: Yes. Family/Significant other contact made: No, will contact:  supports if consents are granted/ Patient understands diagnosis: Yes. Discussing patient identified problems/goals with staff: Yes. Medical problems stabilized or resolved: No. Denies suicidal/homicidal ideation: No. Issues/concerns per patient self-inventory: Yes.   New problem(s) identified: Yes, Describe:  homeless, legal issues  New Short Term/Long Term Goal(s): detox, medication management for mood stabilization; elimination of SI thoughts; development of comprehensive mental wellness/sobriety plan.  Patient Goals:    Discharge Plan or Barriers: CSW continuing to assess for appropriate referrals.  Reason for Continuation of Hospitalization: Anxiety Depression Suicidal ideation  Estimated Length of Stay: 3-5 days  Attendees: Patient: 10/11/2018 8:58 AM  Physician: Larene Beach 10/11/2018 8:58 AM  Nursing:  10/11/2018 8:58 AM  RN Care Manager: 10/11/2018 8:58 AM  Social Worker: Stephanie Acre, [place] 10/11/2018 8:58 AM  Recreational Therapist:  10/11/2018 8:58 AM  Other:  10/11/2018 8:58 AM  Other:  10/11/2018 8:58 AM  Other: 10/11/2018 8:58 AM    Scribe for Treatment Team: Joellen Jersey, [place] 10/11/2018 8:58 AM

## 2018-10-11 NOTE — Progress Notes (Signed)
Patient ID: Justn Weick, male   DOB: 10/12/1975, 43 y.o.   MRN: [id number]  [place] NOVEL CORONAVIRUS (COVID-19) DAILY CHECK-OFF SYMPTOMS - answer yes or no to each - every day NO YES  Have you had a fever in the past 24 hours?  . Fever (Temp > 37.80C / 100F) X   Have you had any of these symptoms in the past 24 hours? . New Cough .  Sore Throat  .  Shortness of Breath .  Difficulty Breathing .  Unexplained Body Aches   X   Have you had any one of these symptoms in the past 24 hours not related to allergies?   . Runny Nose .  Nasal Congestion .  Sneezing   X   If you have had runny nose, nasal congestion, sneezing in the past 24 hours, has it worsened?  X   EXPOSURES - check yes or no X   Have you traveled outside the state in the past 14 days?  X   Have you been in contact with someone with a confirmed diagnosis of COVID-19 or PUI in the past 14 days without wearing appropriate PPE?  X   Have you been living in the same home as a person with confirmed diagnosis of COVID-19 or a PUI (household contact)?    X   Have you been diagnosed with COVID-19?    X              What to do next: Answered NO to all: Answered YES to anything:   Proceed with unit schedule Follow the [facility] Inpatient Flowsheet.   

## 2018-10-11 NOTE — Progress Notes (Signed)
Patient ID: Steve Davenport, male   DOB: 23-Nov-1975, 43 y.o.   MRN: [id number]  Nursing Progress Note [id number]  Patient presents pleasant but anxious. Patient compliant with scheduled medications and requests PRN Vistaril. Patient is seen attending groups and visible in the milieu. Patient currently denies SI/HI/AVH.   Patient is educated about and provided medication per provider's orders. Patient safety maintained with q15 min safety checks and low fall risk precautions. Emotional support given, 1:1 interaction, and active listening provided. Patient encouraged to attend meals, groups, and work on treatment plan and goals. Labs, vital signs and patient behavior monitored throughout shift.   Patient contracts for safety with staff. Patient remains safe on the unit at this time and agrees to come to staff with any issues/concerns. Patient is interacting with peers appropriately on the unit. Will continue to support and monitor.  Patient's self-inventory sheet Rated Energy Level  Low  Rated Sleep  Poor  Rated Appetite  Poor  Rated Anxiety (0-10)  10  Rated Hopelessness (0-10)  10   Rated Depression (0-10)  9  Daily Goal  "getting my energy back"  Any Additional Comments:
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
## 2018-10-11 NOTE — Progress Notes (Signed)
Recreation Therapy Notes  Date:  6.1.20 Time: 0930 Location: 300 Hall Dayroom  Group Topic: Stress Management  Goal Area(s) Addresses:  Patient will identify positive stress management techniques. Patient will identify benefits of using stress management post d/c.  Intervention:  Stress Management  Activity :  Meditation.  LRT introduced the stress management technique of meditation.  LRT played a meditation that focused on make the most of your day.   Education:  Stress Management, Discharge Planning.   Education Outcome: Acknowledges Education  Clinical Observations/Feedback:  Pt did not attend group.    Walfred Bettendorf, LRT/CTRS         Trystyn Dolley A 10/11/2018 11:13 AM 

## 2018-10-12 DIAGNOSIS — F1020 Alcohol dependence, uncomplicated: Secondary | ICD-10-CM

## 2018-10-12 DIAGNOSIS — F3130 Bipolar disorder, current episode depressed, mild or moderate severity, unspecified: Principal | ICD-10-CM

## 2018-10-12 LAB — LIPID PANEL
Cholesterol: 160 mg/dL (ref 0–200)
HDL: 61 mg/dL (ref >40)
LDL Cholesterol: 85 mg/dL (ref 0–99)
Total CHOL/HDL Ratio: 2.6 RATIO
Triglycerides: 68 mg/dL (ref <150)
VLDL: 14 mg/dL (ref 0–40)

## 2018-10-12 LAB — BASIC METABOLIC PANEL
Anion gap: 9 (ref 5–15)
BUN: 12 mg/dL (ref 6–20)
CO2: 24 mmol/L (ref 22–32)
Calcium: 9.6 mg/dL (ref 8.9–10.3)
Chloride: 104 mmol/L (ref 98–111)
Creatinine, Ser: 0.92 mg/dL (ref 0.61–1.24)
GFR calc Af Amer: >60 mL/min (ref >60)
GFR calc non Af Amer: >60 mL/min (ref >60)
Glucose, Bld: 96 mg/dL (ref 70–99)
Potassium: 4.2 mmol/L (ref 3.5–5.1)
Sodium: 137 mmol/L (ref 135–145)

## 2018-10-12 LAB — TSH: TSH: 0.875 u[IU]/mL (ref 0.350–4.500)

## 2018-10-12 LAB — HEMOGLOBIN A1C
Hgb A1c MFr Bld: 5.3 % (ref 4.8–5.6)
Mean Plasma Glucose: 105.41 mg/dL

## 2018-10-12 MED ORDER — QUETIAPINE FUMARATE 50 MG PO TABS
50.0000 mg | ORAL_TABLET | Freq: Every day | ORAL | Status: DC
  Administered 2018-10-12 – 2018-10-13 (×2): 50 mg via ORAL
  Filled 2018-10-12 (×4): qty 1
  Filled 2018-10-12: qty 14

## 2018-10-12 NOTE — Progress Notes (Signed)
Pt attended spiritual care group on grief and loss facilitated by chaplain Kattaleya Alia  Group Goal:  Support / Education around grief and loss Members engage in facilitated group support and psycho social education.  Group Description:  Following introductions and group rules,  Group members engaged in facilitated group dialog and support around topic of loss, with particular support around experiences of loss in their lives. Group Identified types of loss (relationships / self / things) and identified patterns, circumstances, and changes that precipitate losses. Reflected on thoughts / feelings around loss, normalized grief responses, and recognized variety in grief experience. Patient Progress:  

## 2018-10-12 NOTE — Progress Notes (Signed)
[facility] MD Progress Note  10/12/2018 11:11 AM Steve Davenport  MRN:  [id number] Subjective:  "I'm feeling weak."  Steve Davenport found lying in bed. He reports withdrawal symptoms- muscle aches, fatigue, diaphoresis, anxiety. Denies N/V or diarrhea. He reports some improvement in mood but continues to report anxiety. He feels anxiety is related to withdrawal. Denies SI/HI/AVH. He does report some difficulty sleeping last night. He awoke midway through the night and could not fall back asleep. Chart review shows 6 hours of sleep overnight. He was previously on Seroquel 100 mg QHS but reports this was too sedating in daytime. He has been active in unit milieu, participating in some groups. No agitated or disruptive behaviors. He is requesting inpatient rehab after discharge. Hemoglobin a1c 5.3. Other morning labs are still pending.  From admission H&P: Steve Davenport is a 43 year old male with history of bipolar disorder, polysubstance abuse, hepatitis C, hypothyroidism and CAD, presenting for treatment after overdose on fentanyl. He reports suicidal intent behind overdose. He is currently homeless. He reports relapsing on fentanyl about a week ago and had also relapsed on alcohol several days ago, drinking about a case of beer daily. UDS positive for opioids and cocaine. No BAL was drawn on admission.  Principal Problem: <principal problem not specified> Diagnosis: Active Problems:   Bipolar I disorder, most recent episode depressed (HCC)   Opioid dependence with opioid-induced mood disorder (HCC)  Total Time spent with patient: 15 minutes  Past Psychiatric History: See admission H&P  Past Medical History:  Past Medical History:  Diagnosis Date  . Anxiety   . Bipolar disorder (HCC)   . Depression   . Kidney stone   . Mental health disorder   . Myocardial infarction [facility]) 2006   stress/anxiety. cath done no damage. no stents.   . Pancreatitis    three hospitalizations, per patient secondary to etoh  .  Polysubstance abuse (HCC)   . Suicidal ideation 10/09/2018    Past Surgical History:  Procedure Laterality Date  . CARDIAC CATHETERIZATION  11/05/2005   normal coronary arteries with EF 50% ([facility])  . COLONOSCOPY    . ESOPHAGEAL DILATION     Family History:  Family History  Problem Relation Age of Onset  . Cancer Mother        breast  . Cancer Father        lymphoma   Family Psychiatric  History: See admission H&P Social History:  Social History   Substance and Sexual Activity  Alcohol Use No   Comment: history heavy etoh in his 3's, quit at age 83     Social History   Substance and Sexual Activity  Drug Use No  . Types: Heroin, Cocaine   Comment: last use 05/2016    Social History   Socioeconomic History  . Marital status: Single    Spouse name: Not on file  . Number of children: Not on file  . Years of education: Not on file  . Highest education level: Not on file  Occupational History  . Not on file  Social Needs  . Financial resource strain: Not on file  . Food insecurity:    Worry: Not on file    Inability: Not on file  . Transportation needs:    Medical: Not on file    Non-medical: Not on file  Tobacco Use  . Smoking status: Current Every Day Smoker    Packs/day: 0.50    Types: Cigarettes  . Smokeless tobacco: Never Used  Substance and  Sexual Activity  . Alcohol use: No    Comment: history heavy etoh in his 4220's, quit at age 43  . Drug use: No    Types: Heroin, Cocaine    Comment: last use 05/2016  . Sexual activity: Not on file  Lifestyle  . Physical activity:    Days per week: Not on file    Minutes per session: Not on file  . Stress: Not on file  Relationships  . Social connections:    Talks on phone: Not on file    Gets together: Not on file    Attends religious service: Not on file    Active member of club or organization: Not on file    Attends meetings of clubs or organizations: Not on file    Relationship status: Not on file   Other Topics Concern  . Not on file  Social History Narrative  . Not on file   Additional Social History:                         Sleep: Poor  Appetite:  Fair  Current Medications: Current Facility-Administered Medications  Medication Dose Route Frequency Provider Last Rate Last Dose  . acetaminophen (TYLENOL) tablet 650 mg  650 mg Oral Q6H PRN Money, Gerlene Burdockravis B, FNP      . alum & mag hydroxide-simeth (MAALOX/MYLANTA) 200-200-20 MG/5ML suspension 30 mL  30 mL Oral Q4H PRN Money, Feliz Beamravis B, FNP      . cloNIDine (CATAPRES) tablet 0.1 mg  0.1 mg Oral QID Cobos, Rockey SituFernando A, MD   0.1 mg at 10/12/18 0813   Followed by  . [START ON 10/13/2018] cloNIDine (CATAPRES) tablet 0.1 mg  0.1 mg Oral BH-qamhs Cobos, Rockey SituFernando A, MD       Followed by  . [START ON 10/16/2018] cloNIDine (CATAPRES) tablet 0.1 mg  0.1 mg Oral QAC breakfast Cobos, Rockey SituFernando A, MD      . dicyclomine (BENTYL) tablet 20 mg  20 mg Oral Q6H PRN Cobos, Fernando A, MD      . DULoxetine (CYMBALTA) DR capsule 20 mg  20 mg Oral Daily Cobos, Rockey SituFernando A, MD   20 mg at 10/12/18 0813  . hydrOXYzine (ATARAX/VISTARIL) tablet 25 mg  25 mg Oral Q6H PRN Cobos, Fernando A, MD      . loperamide (IMODIUM) capsule 2-4 mg  2-4 mg Oral PRN Cobos, Rockey SituFernando A, MD      . LORazepam (ATIVAN) tablet 1 mg  1 mg Oral Q6H PRN Money, Gerlene Burdockravis B, FNP   1 mg at 10/11/18 2044  . magnesium hydroxide (MILK OF MAGNESIA) suspension 30 mL  30 mL Oral Daily PRN Money, Gerlene Burdockravis B, FNP      . methocarbamol (ROBAXIN) tablet 500 mg  500 mg Oral Q8H PRN Cobos, Rockey SituFernando A, MD      . multivitamin with minerals tablet 1 tablet  1 tablet Oral Daily Money, Gerlene Burdockravis B, FNP   1 tablet at 10/12/18 0813  . naproxen (NAPROSYN) tablet 500 mg  500 mg Oral BID PRN Cobos, Rockey SituFernando A, MD      . nicotine (NICODERM CQ - dosed in mg/24 hours) patch 21 mg  21 mg Transdermal Daily Cobos, Rockey SituFernando A, MD   21 mg at 10/12/18 0814  . ondansetron (ZOFRAN-ODT) disintegrating tablet 4 mg  4 mg Oral  Q6H PRN Cobos, Rockey SituFernando A, MD      . QUEtiapine (SEROQUEL) tablet 25 mg  25 mg Oral QHS Cobos,  Rockey Situ, MD   25 mg at 10/11/18 2044  . thiamine (B-1) injection 100 mg  100 mg Intramuscular Once Money, Feliz Beam B, FNP      . thiamine (VITAMIN B-1) tablet 100 mg  100 mg Oral Daily Money, Gerlene Burdock, FNP   100 mg at 10/12/18 0813  . traZODone (DESYREL) tablet 25 mg  25 mg Oral QHS PRN Cobos, Rockey Situ, MD        Lab Results:  Results for orders placed or performed during the hospital encounter of 10/10/18 (from the past 48 hour(s))  Hemoglobin A1c     Status: None   Collection Time: 10/12/18  6:51 AM  Result Value Ref Range   Hgb A1c MFr Bld 5.3 4.8 - 5.6 %    Comment: (NOTE) Pre diabetes:          5.7%-6.4% Diabetes:              >6.4% Glycemic control for   <7.0% adults with diabetes    Mean Plasma Glucose 105.41 mg/dL    Comment: Performed at [facility], [address], [place], [place] [address]    Blood Alcohol level:  Lab Results  Component Value Date   ETH <5 07/20/2016    Metabolic Disorder Labs: Lab Results  Component Value Date   HGBA1C 5.3 10/12/2018   MPG 105.41 10/12/2018   MPG 111 07/25/2016   No results found for: PROLACTIN Lab Results  Component Value Date   CHOL 164 07/25/2016   TRIG 127 07/25/2016   HDL 54 07/25/2016   CHOLHDL 3.0 07/25/2016   VLDL 25 07/25/2016   LDLCALC 85 07/25/2016    Physical Findings: AIMS: Facial and Oral Movements Muscles of Facial Expression: None, normal Lips and Perioral Area: None, normal Jaw: None, normal Tongue: None, normal,Extremity Movements Upper (arms, wrists, hands, fingers): None, normal Lower (legs, knees, ankles, toes): None, normal, Trunk Movements Neck, shoulders, hips: None, normal, Overall Severity Severity of abnormal movements (highest score from questions above): None, normal Incapacitation due to abnormal movements: None, normal Patient's awareness of abnormal movements (rate only  patient's report): No Awareness, Dental Status Current problems with teeth and/or dentures?: No Does patient usually wear dentures?: No  CIWA:  CIWA-Ar Total: 2 COWS:  COWS Total Score: 4  Musculoskeletal: Strength & Muscle Tone: within normal limits Gait & Station: normal Patient leans: N/A  Psychiatric Specialty Exam: Physical Exam  Nursing note and vitals reviewed. Constitutional: He is oriented to person, place, and time. He appears well-developed and well-nourished.  Cardiovascular: Normal rate.  Respiratory: Effort normal.  Neurological: He is alert and oriented to person, place, and time.    Review of Systems  Constitutional: Positive for diaphoresis and malaise/fatigue. Negative for fever.  Respiratory: Negative for cough and shortness of breath.   Cardiovascular: Negative for chest pain.  Gastrointestinal: Negative for diarrhea, nausea and vomiting.  Neurological: Positive for tremors and weakness. Negative for dizziness and headaches.  Psychiatric/Behavioral: Positive for depression and substance abuse. Negative for hallucinations and suicidal ideas. The patient has insomnia. The patient is not nervous/anxious.     Blood pressure 93/62, pulse 88, temperature 98.6 F (37 C), temperature source Oral, resp. rate 18, height  (1.753 m), weight 70.3 kg, SpO2 98 %.Body mass index is 22.89 kg/m.  General Appearance: Disheveled  Eye Contact:  Good  Speech:  Clear and Coherent and Normal Rate  Volume:  Normal  Mood:  Depressed  Affect:  Constricted  Thought Process:  Coherent  Orientation:  Full (Time, Place, and Person)  Thought Content:  Logical  Suicidal Thoughts:  No  Homicidal Thoughts:  No  Memory:  Immediate;   Fair Recent;   Fair  Judgement:  Intact  Insight:  Fair  Psychomotor Activity:  Normal  Concentration:  Concentration: Good  Recall:  Fair  Fund of Knowledge:  Fair  Language:  Good  Akathisia:  No  Handed:  Right  AIMS (if indicated):      Assets:  Communication Skills Desire for Improvement Leisure Time  ADL's:  Intact  Cognition:  WNL  Sleep:  Number of Hours: 6     Treatment Plan Summary: Daily contact with patient to assess and evaluate symptoms and progress in treatment and Medication management   Continue inpatient hospitalization.  Continue clonidine COWS protocol for opioid withdrawal Continue Ativan PRN CIWA>10 for ETOH withdrawal Continue Cymbalta 20 mg PO daily for mood/anxiety Increase Seroquel to 50 mg PO QHS for sleep Continue trazodone 25 mg PO QHS PRN insomnia Continue Vistaril 25 mg PO Q6HR PRN anxiety  Patient will participate in the therapeutic group milieu.  Discharge disposition in progress.   Aldean Baker, NP 10/12/2018, 11:11 AM

## 2018-10-12 NOTE — Progress Notes (Signed)
Patient ID: Steve Davenport, male   DOB: 1975/10/19, 43 y.o.   MRN: [id number]  Nursing Progress Note [id number]  Patient presents anxious and sullen this morning with complaints of withdrawal. Patient compliant with scheduled medications. Patient currently denies SI/HI/AVH.   Patient is educated about and provided medication per provider's orders. Patient safety maintained with q15 min safety checks and high fall risk precautions. Emotional support given, 1:1 interaction, and active listening provided. Patient encouraged to attend meals, groups, and work on treatment plan and goals. Labs, vital signs and patient behavior monitored throughout shift.   Patient contracts for safety with staff. Patient remains safe on the unit at this time and agrees to come to staff with any issues/concerns. Patient is interacting with peers appropriately on the unit. Will continue to support and monitor.

## 2018-10-13 DIAGNOSIS — F1024 Alcohol dependence with alcohol-induced mood disorder: Secondary | ICD-10-CM

## 2018-10-13 MED ORDER — DULOXETINE HCL 20 MG PO CPEP: 20.0000 mg | ORAL_CAPSULE | Freq: Every day | ORAL | 0 refills | Status: AC

## 2018-10-13 MED ORDER — QUETIAPINE FUMARATE 50 MG PO TABS: 50.0000 mg | ORAL_TABLET | Freq: Every day | ORAL | 0 refills | Status: AC

## 2018-10-13 MED ORDER — NICOTINE 21 MG/24HR TD PT24: 21.0000 mg | MEDICATED_PATCH | Freq: Every day | TRANSDERMAL | 0 refills | Status: AC

## 2018-10-13 NOTE — Progress Notes (Signed)
[place] NOVEL CORONAVIRUS (COVID-19) DAILY CHECK-OFF SYMPTOMS - answer yes or no to each - every day NO YES  Have you had a fever in the past 24 hours?  . Fever (Temp > 37.80C / 100F) X   Have you had any of these symptoms in the past 24 hours? . New Cough .  Sore Throat  .  Shortness of Breath .  Difficulty Breathing .  Unexplained Body Aches   X   Have you had any one of these symptoms in the past 24 hours not related to allergies?   . Runny Nose .  Nasal Congestion .  Sneezing   X   If you have had runny nose, nasal congestion, sneezing in the past 24 hours, has it worsened?  X   EXPOSURES - check yes or no X   Have you traveled outside the state in the past 14 days?  X   Have you been in contact with someone with a confirmed diagnosis of COVID-19 or PUI in the past 14 days without wearing appropriate PPE?  X   Have you been living in the same home as a person with confirmed diagnosis of COVID-19 or a PUI (household contact)?    X   Have you been diagnosed with COVID-19?    X              What to do next: Answered NO to all: Answered YES to anything:   Proceed with unit schedule Follow the [facility] Inpatient Flowsheet.   

## 2018-10-13 NOTE — [facility] Group Notes (Signed)
Adult Psychoeducational Group Note  Date:  10/13/2018 Time:  10:28 PM  Group Topic/Focus:  Wrap-Up Group:   The focus of this group is to help patients review their daily goal of treatment and discuss progress on daily workbooks.  Participation Level:  Active  Participation Quality:  Appropriate  Affect:  Appropriate  Cognitive:  Appropriate  Insight: Appropriate  Engagement in Group:  Engaged  Modes of Intervention:  Discussion and Education  Additional Comments:  Pt attended and participated in wrap up group this evening. Pt rated their day an 8/10. Pt Completed their goal which was to stay up all day.   Chrisandra Netters 10/13/2018, 10:28 PM

## 2018-10-13 NOTE — Discharge Summary (Signed)
Physician Discharge Summary Note  Patient:  Steve Davenport is an 43 y.o., male MRN:  [id number] DOB:  04-04-1976 Patient phone:  [phone] (home)  Patient address:   Homeless [place] [address],  Total Time spent with patient: 15 minutes  Date of Admission:  10/10/2018 Date of Discharge: 10/14/18  Reason for Admission:  Overdose on fentanyl  Principal Problem: <principal problem not specified> Discharge Diagnoses: Active Problems:   Bipolar I disorder, most recent episode depressed (HCC)   Opioid dependence with opioid-induced mood disorder (HCC)   Alcohol use disorder, severe, dependence (HCC)   Past Psychiatric History: History of depression and opioid use disorder. Multiple hospitalizations for depression and substance use, most recently at [facility] in May 2020. Denies history of suicide attempts.  Past Medical History:  Past Medical History:  Diagnosis Date  . Anxiety   . Bipolar disorder (HCC)   . Depression   . Kidney stone   . Mental health disorder   . Myocardial infarction [facility]) 2006   stress/anxiety. cath done no damage. no stents.   . Pancreatitis    three hospitalizations, per patient secondary to etoh  . Polysubstance abuse (HCC)   . Suicidal ideation 10/09/2018    Past Surgical History:  Procedure Laterality Date  . CARDIAC CATHETERIZATION  11/05/2005   normal coronary arteries with EF 50% ([facility])  . COLONOSCOPY    . ESOPHAGEAL DILATION     Family History:  Family History  Problem Relation Age of Onset  . Cancer Mother        breast  . Cancer Father        lymphoma   Family Psychiatric  History: Father with PTSD. Several family members with possible bipolar disorder. Social History:  Social History   Substance and Sexual Activity  Alcohol Use No   Comment: history heavy etoh in his 37's, quit at age 29     Social History   Substance and Sexual Activity  Drug Use No  . Types: Heroin, Cocaine   Comment: last use 05/2016    Social History    Socioeconomic History  . Marital status: Single    Spouse name: Not on file  . Number of children: Not on file  . Years of education: Not on file  . Highest education level: Not on file  Occupational History  . Not on file  Social Needs  . Financial resource strain: Not on file  . Food insecurity:    Worry: Not on file    Inability: Not on file  . Transportation needs:    Medical: Not on file    Non-medical: Not on file  Tobacco Use  . Smoking status: Current Every Day Smoker    Packs/day: 0.50    Types: Cigarettes  . Smokeless tobacco: Never Used  Substance and Sexual Activity  . Alcohol use: No    Comment: history heavy etoh in his 46's, quit at age 18  . Drug use: No    Types: Heroin, Cocaine    Comment: last use 05/2016  . Sexual activity: Not on file  Lifestyle  . Physical activity:    Days per week: Not on file    Minutes per session: Not on file  . Stress: Not on file  Relationships  . Social connections:    Talks on phone: Not on file    Gets together: Not on file    Attends religious service: Not on file    Active member of club or organization: Not on file  Attends meetings of clubs or organizations: Not on file    Relationship status: Not on file  Other Topics Concern  . Not on file  Social History Narrative  . Not on file    Hospital Course:  From admission H&P: Steve Davenport is a 43 year old male with history of bipolar disorder, polysubstance abuse, hepatitis C, hypothyroidism and CAD, presenting for treatment after overdose on fentanyl. He reports suicidal intent behind overdose. He is currently homeless. He reports relapsing on fentanyl about a week ago and had also relapsed on alcohol several days ago, drinking about a case of beer daily. UDS positive for opioids and cocaine. No BAL was drawn on admission. He reports depressed mood and relapse were related to recently losing his job and housing after losing transportation to work. He reports compliance  with Seroquel 100 mg PO QHS and Remeron 15 mg PO QHS, but states the Seroquel is a new medication and is too sedating. Denies SI/HI/AVH. He endorses withdrawal symptoms- generalized aches, weakness, rhinorrhea, nausea. Patient reports he has not been hospitalized for years but chart review shows hospitalization at [facility] 09/12/18-09/18/18 for depression and opioid/BZD abuse- discharged to [facility] in [place], [place] on Seroquel 200 mg QHS and Remeron mg 15 QHS at that time.  Steve Davenport was admitted after overdose on fentanyl. Clonidine COWS protocol was started for opioid withdrawal. Remeron was discontinued due to patient's report of sedation. Seroquel was decreased to 50 mg QHS, and Cymbalta was started. He participated in group therapy on the unit. He requested inpatient rehab, and appropriate referrals were made. Patient was accepted for screening for [facility] 10/14/18. He remained on the [facility] unit for 5 days. He stabilized with medication and therapy. He was discharged on the medications listed below. He has shown improvement with improved mood, affect, sleep, appetite, and interaction. He denies any SI/HI/AVH and contracts for safety. He agrees to [facility] at [facility] (see below). He is provided with prescriptions for medications upon discharge. He is leaving with taxi voucher for [facility].  Physical Findings: AIMS: Facial and Oral Movements Muscles of Facial Expression: None, normal Lips and Perioral Area: Minimal Jaw: Minimal Tongue: Minimal,Extremity Movements Upper (arms, wrists, hands, fingers): None, normal Lower (legs, knees, ankles, toes): None, normal, Trunk Movements Neck, shoulders, hips: None, normal, Overall Severity Severity of abnormal movements (highest score from questions above): None, normal Incapacitation due to abnormal movements: None, normal Patient's awareness of abnormal movements (rate only patient's report): No Awareness, Dental Status Current  problems with teeth and/or dentures?: No Does patient usually wear dentures?: No  CIWA:  CIWA-Ar Total: 5 COWS:  COWS Total Score: 0  Musculoskeletal: Strength & Muscle Tone: within normal limits Gait & Station: normal Patient leans: N/A  Psychiatric Specialty Exam: Physical Exam  Nursing note and vitals reviewed. Constitutional: He is oriented to person, place, and time. He appears well-developed and well-nourished.  Cardiovascular: Normal rate.  Respiratory: Effort normal.  Neurological: He is alert and oriented to person, place, and time.    Review of Systems  Constitutional: Negative.   Psychiatric/Behavioral: Positive for substance abuse. Negative for depression, hallucinations and suicidal ideas. The patient is not nervous/anxious and does not have insomnia.     Blood pressure 92/60, pulse 72, temperature 97.9 F (36.6 C), temperature source Oral, resp. rate 16, height 5\' 9"  (1.753 m), weight 70.3 kg, SpO2 99 %.Body mass index is 22.89 kg/m.  See MD's discharge SRA     Have you used any  form of tobacco in the last 30 days? (Cigarettes, Smokeless Tobacco, Cigars, and/or Pipes): Yes  Has this patient used any form of tobacco in the last 30 days? (Cigarettes, Smokeless Tobacco, Cigars, and/or Pipes) Yes, a prescription for an FDA-approved medication for tobacco cessation was offered at discharge.   Blood Alcohol level:  Lab Results  Component Value Date   ETH <5 07/20/2016    Metabolic Disorder Labs:  Lab Results  Component Value Date   HGBA1C 5.3 10/12/2018   MPG 105.41 10/12/2018   MPG 111 07/25/2016   No results found for: PROLACTIN Lab Results  Component Value Date   CHOL 160 10/12/2018   TRIG 68 10/12/2018   HDL 61 10/12/2018   CHOLHDL 2.6 10/12/2018   VLDL 14 10/12/2018   LDLCALC 85 10/12/2018   LDLCALC 85 07/25/2016    See Psychiatric Specialty Exam and Suicide Risk Assessment completed by Attending Physician prior to discharge.  Discharge  destination:  [facility]  Is patient on multiple antipsychotic therapies at discharge:  No   Has Patient had three or more failed trials of antipsychotic monotherapy by history:  No  Recommended Plan for Multiple Antipsychotic Therapies: NA  Discharge Instructions    Discharge instructions   Complete by:  As directed    Patient is instructed to take all prescribed medications as recommended. Report any side effects or adverse reactions to your outpatient psychiatrist. Patient is instructed to abstain from alcohol and illegal drugs while on prescription medications. In the event of worsening symptoms, patient is instructed to call the crisis hotline, 911, or go to the nearest emergency department for evaluation and treatment.     Allergies as of 10/14/2018      Reactions   Tilapia [fish Allergy] Shortness Of Breath   Swelling of throat   Tramadol Itching      Medication List    STOP taking these medications   esomeprazole 20 MG capsule Commonly known as:  NEXIUM   FLUoxetine 20 MG capsule Commonly known as:  PROZAC   [employer] Extra Strength 520-260-32.5 MG Pack Generic drug:  Aspirin-Acetaminophen-Caffeine   mirtazapine 15 MG tablet Commonly known as:  REMERON   traZODone 100 MG tablet Commonly known as:  DESYREL     TAKE these medications     Indication  DULoxetine 20 MG capsule Commonly known as:  CYMBALTA Take 1 capsule (20 mg total) by mouth daily.  Indication:  Major Depressive Disorder   nicotine 21 mg/24hr patch Commonly known as:  NICODERM CQ - dosed in mg/24 hours Place 1 patch (21 mg total) onto the skin daily.  Indication:  Nicotine Addiction   QUEtiapine 50 MG tablet Commonly known as:  SEROQUEL Take 1 tablet (50 mg total) by mouth at bedtime.  Indication:  Major Depressive Disorder      Follow-up Information    [facility] Follow up on 10/14/2018.   Why:  Please attend your screening for admission on Thursday, 6/4 at 7:45a.   Be sure to bring your photo ID, proof of residency, 30-day supply of medications and 2 weeks of clothing.  Contact information: [address] [place] [address] [phone]           Follow-up recommendations: Activity as tolerated. Diet as recommended by primary care physician. Keep all scheduled follow-up appointments as recommended.   Comments:   Patient is instructed to take all prescribed medications as recommended. Report any side effects or adverse reactions to your outpatient psychiatrist. Patient  is instructed to abstain from alcohol and illegal drugs while on prescription medications. In the event of worsening symptoms, patient is instructed to call the crisis hotline, 911, or go to the nearest emergency department for evaluation and treatment.  Signed: Aldean BakerJanet E Kenneith Stief, NP 10/14/2018, 7:55 AM

## 2018-10-13 NOTE — Progress Notes (Signed)
[facility]  10/13/2018 9:20 AM Sharlet SalinaMichael Denn  MRN:  [id number] Subjective: Patient is a 43 year old male with a past psychiatric history significant for bipolar disorder, polysubstance abuse, hepatitis C, hypothyroidism and coronary artery disease who presented on 5/30 after an intentional overdose of fentanyl.  He reported this was with suicidal intent.  Objective: Patient is seen and examined.  Patient is a 43 year old male with the above-stated past psychiatric history who is seen in follow-up.  He continues to slowly improve.  [facility].  We discussed that today.  He denied any suicidal ideation.  He denied any current withdrawal symptoms.  His blood pressure has been low during the course the hospitalization, and we discussed stopping his clonidine detox protocol today as well as the lorazepam.  He slept 6.75 hours last night.  Review of his laboratories revealed an AST of 53 and an ALT of 76 on 5/30.  2 years ago his AST was 61 and his ALT was 88, so these are essentially unchanged.  Principal Problem: <principal problem not specified> Diagnosis: Active Problems:   Bipolar I disorder, most recent episode depressed (HCC)   Opioid dependence with opioid-induced mood disorder (HCC)   Alcohol use disorder, severe, dependence (HCC)  Total Time spent with patient: 15 minutes  Past Psychiatric History: See admission H&P  Past Medical History:  Past Medical History:  Diagnosis Date  . Anxiety   . Bipolar disorder (HCC)   . Depression   . Kidney stone   . Mental health disorder   . Myocardial infarction [facility](HCC) 2006   stress/anxiety. cath done no damage. no stents.   . Pancreatitis    three hospitalizations, per patient secondary to etoh  . Polysubstance abuse (HCC)   . Suicidal ideation 10/09/2018    Past Surgical History:  Procedure Laterality Date  . CARDIAC CATHETERIZATION  11/05/2005   normal coronary arteries with EF 50% ([facility])  .  COLONOSCOPY    . ESOPHAGEAL DILATION     Family History:  Family History  Problem Relation Age of Onset  . Cancer Mother        breast  . Cancer Father        lymphoma   Family Psychiatric  History: See admission H&P Social History:  Social History   Substance and Sexual Activity  Alcohol Use No   Comment: history heavy etoh in his 5420's, quit at age 43     Social History   Substance and Sexual Activity  Drug Use No  . Types: Heroin, Cocaine   Comment: last use 05/2016    Social History   Socioeconomic History  . Marital status: Single    Spouse name: Not on file  . Number of children: Not on file  . Years of education: Not on file  . Highest education level: Not on file  Occupational History  . Not on file  Social Needs  . Financial resource strain: Not on file  . Food insecurity:    Worry: Not on file    Inability: Not on file  . Transportation needs:    Medical: Not on file    Non-medical: Not on file  Tobacco Use  . Smoking status: Current Every Day Smoker    Packs/day: 0.50    Types: Cigarettes  . Smokeless tobacco: Never Used  Substance and Sexual Activity  . Alcohol use: No    Comment: history heavy etoh in his 2920's, quit at age 43  .  Drug use: No    Types: Heroin, Cocaine    Comment: last use 05/2016  . Sexual activity: Not on file  Lifestyle  . Physical activity:    Days per week: Not on file    Minutes per session: Not on file  . Stress: Not on file  Relationships  . Social connections:    Talks on phone: Not on file    Gets together: Not on file    Attends religious service: Not on file    Active member of club or organization: Not on file    Attends meetings of clubs or organizations: Not on file    Relationship status: Not on file  Other Topics Concern  . Not on file  Social History Narrative  . Not on file   Additional Social History:                         Sleep: Good  Appetite:  Good  Current  Medications: Current Facility-Administered Medications  Medication Dose Route Frequency Provider Last Rate Last Dose  . acetaminophen (TYLENOL) tablet 650 mg  650 mg Oral Q6H PRN Money, Gerlene Burdock, FNP      . alum & mag hydroxide-simeth (MAALOX/MYLANTA) 200-200-20 MG/5ML suspension 30 mL  30 mL Oral Q4H PRN Money, Gerlene Burdock, FNP      . dicyclomine (BENTYL) tablet 20 mg  20 mg Oral Q6H PRN Cobos, Fernando A, MD      . DULoxetine (CYMBALTA) DR capsule 20 mg  20 mg Oral Daily Cobos, Rockey Situ, MD   20 mg at 10/12/18 0813  . hydrOXYzine (ATARAX/VISTARIL) tablet 25 mg  25 mg Oral Q6H PRN Cobos, Rockey Situ, MD   25 mg at 10/12/18 1449  . loperamide (IMODIUM) capsule 2-4 mg  2-4 mg Oral PRN Cobos, Rockey Situ, MD      . magnesium hydroxide (MILK OF MAGNESIA) suspension 30 mL  30 mL Oral Daily PRN Money, Gerlene Burdock, FNP      . methocarbamol (ROBAXIN) tablet 500 mg  500 mg Oral Q8H PRN Cobos, Rockey Situ, MD   500 mg at 10/12/18 2221  . multivitamin with minerals tablet 1 tablet  1 tablet Oral Daily Money, Gerlene Burdock, FNP   1 tablet at 10/12/18 0813  . naproxen (NAPROSYN) tablet 500 mg  500 mg Oral BID PRN Cobos, Rockey Situ, MD   500 mg at 10/12/18 2221  . nicotine (NICODERM CQ - dosed in mg/24 hours) patch 21 mg  21 mg Transdermal Daily Cobos, Rockey Situ, MD   21 mg at 10/12/18 0814  . ondansetron (ZOFRAN-ODT) disintegrating tablet 4 mg  4 mg Oral Q6H PRN Cobos, Rockey Situ, MD   4 mg at 10/12/18 1449  . QUEtiapine (SEROQUEL) tablet 50 mg  50 mg Oral QHS Aldean Baker, NP   50 mg at 10/12/18 2221  . thiamine (B-1) injection 100 mg  100 mg Intramuscular Once Money, Feliz Beam B, FNP      . thiamine (VITAMIN B-1) tablet 100 mg  100 mg Oral Daily Money, Gerlene Burdock, FNP   100 mg at 10/12/18 0813  . traZODone (DESYREL) tablet 25 mg  25 mg Oral QHS PRN Cobos, Rockey Situ, MD        Lab Results:  Results for orders placed or performed during the hospital encounter of 10/10/18 (from the past 48 hour(s))  Basic metabolic  panel     Status: None   Collection Time: 10/12/18  6:51 AM  Result Value Ref Range   Sodium 137 135 - 145 mmol/L   Potassium 4.2 3.5 - 5.1 mmol/L   Chloride 104 98 - 111 mmol/L   CO2 24 22 - 32 mmol/L   Glucose, Bld 96 70 - 99 mg/dL   BUN 12 6 - 20 mg/dL   Creatinine, Ser 7.00 0.61 - 1.24 mg/dL   Calcium 9.6 8.9 - 17.4 mg/dL   GFR calc non Af Amer >60 >60 mL/min   GFR calc Af Amer >60 >60 mL/min   Anion gap 9 5 - 15    Comment: Performed at [facility], [address], [place], [place] [address]  TSH     Status: None   Collection Time: 10/12/18  6:51 AM  Result Value Ref Range   TSH 0.875 0.350 - 4.500 uIU/mL    Comment: Performed by a 3rd Generation assay with a functional sensitivity of <=0.01 uIU/mL. Performed at [facility], [address] [place], [place] [address]   Hemoglobin A1c     Status: None   Collection Time: 10/12/18  6:51 AM  Result Value Ref Range   Hgb A1c MFr Bld 5.3 4.8 - 5.6 %    Comment: (NOTE) Pre diabetes:          5.7%-6.4% Diabetes:              >6.4% Glycemic control for   <7.0% adults with diabetes    Mean Plasma Glucose 105.41 mg/dL    Comment: Performed at [facility], [address], [place], [place] [address]  Lipid panel     Status: None   Collection Time: 10/12/18  6:51 AM  Result Value Ref Range   Cholesterol 160 0 - 200 mg/dL   Triglycerides 68 <599 mg/dL   HDL 61 >35 mg/dL   Total CHOL/HDL Ratio 2.6 RATIO   VLDL 14 0 - 40 mg/dL   LDL Cholesterol 85 0 - 99 mg/dL    Comment:        Total Cholesterol/HDL:CHD Risk Coronary Heart Disease Risk Table                     Men   Women  1/2 Average Risk   3.4   3.3  Average Risk       5.0   4.4  2 X Average Risk   9.6   7.1  3 X Average Risk  23.4   11.0        Use the calculated Patient Ratio above and the CHD Risk Table to determine the patient's CHD Risk.        ATP III CLASSIFICATION (LDL):  <100     mg/dL   Optimal  701-779  mg/dL   Near or  Above                    Optimal  130-159  mg/dL   Borderline  390-300  mg/dL   High  >923     mg/dL   Very High Performed at [facility], [address] [place], [place] [address]     Blood Alcohol level:  Lab Results  Component Value Date   [facility] <5 07/20/2016    Metabolic Disorder Labs: Lab Results  Component Value Date   HGBA1C 5.3 10/12/2018   MPG 105.41 10/12/2018   MPG 111 07/25/2016   No results found for: PROLACTIN Lab Results  Component Value  Date   CHOL 160 10/12/2018   TRIG 68 10/12/2018   HDL 61 10/12/2018   CHOLHDL 2.6 10/12/2018   VLDL 14 10/12/2018   LDLCALC 85 10/12/2018   LDLCALC 85 07/25/2016    Physical Findings: AIMS: Facial and Oral Movements Muscles of Facial Expression: None, normal Lips and Perioral Area: None, normal Jaw: None, normal Tongue: None, normal,Extremity Movements Upper (arms, wrists, hands, fingers): None, normal Lower (legs, knees, ankles, toes): None, normal, Trunk Movements Neck, shoulders, hips: None, normal, Overall Severity Severity of abnormal movements (highest score from questions above): None, normal Incapacitation due to abnormal movements: None, normal Patient's awareness of abnormal movements (rate only patient's report): No Awareness, Dental Status Current problems with teeth and/or dentures?: No Does patient usually wear dentures?: No  CIWA:  CIWA-Ar Total: 1 COWS:  COWS Total Score: 0  Musculoskeletal: Strength & Muscle Tone: within normal limits Gait & Station: normal Patient leans: N/A  Psychiatric Specialty Exam: Physical Exam  Nursing note and vitals reviewed. Constitutional: He is oriented to person, place, and time. He appears well-developed and well-nourished.  HENT:  Head: Normocephalic and atraumatic.  Respiratory: Effort normal.  Neurological: He is alert and oriented to person, place, and time.    ROS  Blood pressure (!) 87/64, pulse 82, temperature (!) 97.3 F (36.3 C),  temperature source Oral, resp. rate 18, height  (1.753 m), weight 70.3 kg, SpO2 99 %.Body mass index is 22.89 kg/m.  General Appearance: Casual  Eye Contact:  Fair  Speech:  Normal Rate  Volume:  Normal  Mood:  Euthymic  Affect:  Congruent  Thought Process:  Coherent and Descriptions of Associations: Intact  Orientation:  Full (Time, Place, and Person)  Thought Content:  Logical  Suicidal Thoughts:  No  Homicidal Thoughts:  No  Memory:  Immediate;   Fair Recent;   Fair Remote;   Fair  Judgement:  Intact  Insight:  Fair  Psychomotor Activity:  Normal  Concentration:  Concentration: Fair and Attention Span: Fair  Recall:  [employer] of Knowledge:  Fair  Language:  Fair  Akathisia:  Negative  Handed:  Right  AIMS (if indicated):     Assets:  Desire for Improvement Resilience  ADL's:  Intact  Cognition:  WNL  Sleep:  Number of Hours: 6.75     Treatment Plan Summary: Daily contact with patient to assess and evaluate symptoms and progress in treatment, Medication management and Plan : Patient is seen and examined.  Patient is a 43 year old male with the above-stated past psychiatric history who is seen in follow-up.   Diagnosis: #1 bipolar disorder type I, most recently depressed, #2 opiate dependence with opiate induced mood disorder, #3 cocaine dependence, #4 history of hepatitis C, #5 elevated liver function enzymes, #6 alcohol dependence. Patient is seen in follow-up.  He is doing better.  His withdrawal symptoms are essentially resolved.  His mood is stable.  No change in his medications today.  We will plan on getting him to [facility] tomorrow. 1.  Continue Cymbalta 20 mg p.o. daily for depression and anxiety. 2.  Continue Seroquel 50 mg p.o. nightly for mood stability and sleep. 3.  Continue thiamine 100 mg p.o. daily for nutritional supplementation. 4.  Continue trazodone 25 mg p.o. nightly as needed insomnia. 5.  Stop lorazepam. 6.  Stop clonidine. 7.  Disposition  planning-patient to [facility] tomorrow for residential substance abuse treatment.  Antonieta Pert, MD 10/13/2018, 9:20 AM

## 2018-10-13 NOTE — Plan of Care (Signed)
Progress note  D: Pt found in bed; allowed to rest. Pt states he feels groggy from the sleep medication. Pt was compliant with medications upon awakening. Pt denied any withdrawal symptoms. Pt denies si/hi/ah/vh and verbally agrees to approach staff if these become apparent or before harming himself/others while at [facility].  A: pt provided support and encouragement. Pt given medication per protocol and standing orders. Q76m safety checks implemented and continued.  R: pt safe on the unit. Will continue to monitor.   Pt progressing in the following metrics  Problem: Education: Goal: Emotional status will improve Outcome: Progressing Goal: Mental status will improve Outcome: Progressing Goal: Verbalization of understanding the information provided will improve Outcome: Progressing   Problem: Activity: Goal: Sleeping patterns will improve Outcome: Progressing

## 2018-10-13 NOTE — BHH Suicide Risk Assessment (Signed)
[facility] Discharge Suicide Risk Assessment   Principal Problem: <principal problem not specified> Discharge Diagnoses: Active Problems:   Bipolar I disorder, most recent episode depressed (HCC)   Opioid dependence with opioid-induced mood disorder (HCC)   Alcohol use disorder, severe, dependence (HCC)   Total Time spent with patient: 15 minutes  Musculoskeletal: Strength & Muscle Tone: within normal limits Gait & Station: normal Patient leans: N/A  Psychiatric Specialty Exam: Review of Systems  All other systems reviewed and are negative.   Blood pressure (!) 87/64, pulse 82, temperature (!) 97.3 F (36.3 C), temperature source Oral, resp. rate 18, height 5\' 9"  (1.753 m), weight 70.3 kg, SpO2 99 %.Body mass index is 22.89 kg/m.  General Appearance: Casual  Eye Contact::  Fair  Speech:  Normal Rate409  Volume:  Normal  Mood:  Euthymic  Affect:  Congruent  Thought Process:  Coherent and Descriptions of Associations: Intact  Orientation:  Full (Time, Place, and Person)  Thought Content:  Logical  Suicidal Thoughts:  No  Homicidal Thoughts:  No  Memory:  Immediate;   Fair Recent;   Fair Remote;   Fair  Judgement:  Intact  Insight:  Fair  Psychomotor Activity:  Normal  Concentration:  Fair  Recall:  [employer] of Knowledge:Fair  Language: Fair  Akathisia:  Negative  Handed:  Right  AIMS (if indicated):     Assets:  Desire for Improvement Resilience  Sleep:  Number of Hours: 6.75  Cognition: WNL  ADL's:  Intact   Mental Status Per Nursing Assessment::   On Admission:  Self-harm thoughts, Self-harm behaviors  Demographic Factors:  Male, Divorced or widowed, Caucasian, Low socioeconomic status and Unemployed  Loss Factors: Financial problems/change in socioeconomic status  Historical Factors: Impulsivity  Risk Reduction Factors:   NA  Continued Clinical Symptoms:  Bipolar Disorder:   Depressive phase Alcohol/Substance Abuse/Dependencies  Cognitive Features  That Contribute To Risk:  None    Suicide Risk:  Minimal: No identifiable suicidal ideation.  Patients presenting with no risk factors but with morbid ruminations; may be classified as minimal risk based on the severity of the depressive symptoms  Follow-up Information    [facility] Follow up on 10/14/2018.   Why:  Please attend your screening for admission on Thursday, 6/4 at 7:45a.  Be sure to bring your photo ID, proof of residency, 30-day supply of medications and 2 weeks of clothing.  Contact information: [address] [place] [address] [phone]           Plan Of Care/Follow-up recommendations:  Activity:  ad lib  Antonieta Pert, MD 10/13/2018, 2:50 PM

## 2018-10-13 NOTE — BHH Group Notes (Signed)
Occupational Therapy Group Note  Date:  10/13/2018 Time:  1:17 PM  Group Topic/Focus:  Self Esteem Action Plan:   The focus of this group is to help patients create a plan to continue to build self-esteem after discharge.  Participation Level:  Active  Participation Quality:  Appropriate  Affect:  Flat  Cognitive:  Alert  Insight: Improving  Engagement in Group:  Engaged  Modes of Intervention:  Activity, Discussion, Education and Socialization  Additional Comments:    S: non offered this date  O: OT tx with focus on self esteem building this date. Education given on definition of self esteem, with both causes of low and high self esteem identified. Activity given for pt to identify a positive/aspiring trait for each letter of the alphabet. Pt to work with peers to help complete activity and build positive thinking.   A: Pt presents to group with flat affect, coming in late past discussion. Pt accepting A-Z activity, completing in entirety and appearing engaged and participatory.  P: OT group will be x1 per week while pt inpatient  Dalphine Handing, MSOT, OTR/L Behavioral Health OT/ Acute Relief OT [facility] Office: [phone]  Dalphine Handing 10/13/2018, 1:17 PM

## 2018-10-14 NOTE — Discharge Instructions (Signed)
Patient educated on discharge instructions and verbalized understanding. Transportation to [facility] provided by [employer]. All of patients belongings, medication samples, and discharge instructions given to him

## 2018-10-14 NOTE — Progress Notes (Signed)
  [facility] Adult Case Management Discharge Plan :  Will you be returning to the same living situation after discharge:  No. Patient discharged to [facility] for treatment.  At discharge, do you have transportation home?: Yes,  taxi Do you have the ability to pay for your medications: No.  Release of information consent forms completed and in the chart;  Patient's signature needed at discharge.  Patient to Follow up at: Follow-up Information    [facility] Follow up on 10/14/2018.   Why:  Please attend your screening for admission on Thursday, 6/4 at 7:45a.  Be sure to bring your photo ID, proof of residency, 30-day supply of medications and 2 weeks of clothing.  Contact information: [address] [place] [address] [phone]           Next level of care provider has access to [facility] Link:yes  Safety Planning and Suicide Prevention discussed: Yes,  with the patient   Have you used any form of tobacco in the last 30 days? (Cigarettes, Smokeless Tobacco, Cigars, and/or Pipes): Yes  Has patient been referred to the Quitline?: Patient refused referral  Patient has been referred for addiction treatment: Yes  Maeola Sarah, LCSWA 10/14/2018, 8:08 AM

## 2018-10-14 NOTE — Progress Notes (Signed)
Patient ID: Steve Davenport, male   DOB: 03-12-1976, 43 y.o.   MRN: [id number] Patient educated on discharge instructions and verbalized understanding. Transportation to [facility] provided by [employer]. All of patients belongings, medication samples, and discharge instructions given to him

## 2018-10-14 NOTE — Progress Notes (Signed)
Patient ID: Steve Davenport, male   DOB: [birth year], 43 y.o.   MRN: [id number] D: Pt denied SI/HI/AVH earlier in the shift, was visible interacting with his peers in the day room, and verbalized readiness for discharge.    A: Pt is being maintained on Q15 minute checks for safety, all meds given as ordered.  R: Will continue to maintain on Q15 minute checks for safety.

## 2018-10-23 ENCOUNTER — Other Ambulatory Visit: Payer: Self-pay

## 2018-10-23 ENCOUNTER — Emergency Department (HOSPITAL_BASED_OUTPATIENT_CLINIC_OR_DEPARTMENT_OTHER): Payer: Self-pay

## 2018-10-23 ENCOUNTER — Emergency Department (HOSPITAL_BASED_OUTPATIENT_CLINIC_OR_DEPARTMENT_OTHER)
Admission: EM | Admit: 2018-10-23 | Discharge: 2018-10-23 | Disposition: A | Discharge status: Home / Self Care | Payer: Self-pay | Attending: Emergency Medicine | Admitting: Emergency Medicine

## 2018-10-23 ENCOUNTER — Encounter (HOSPITAL_BASED_OUTPATIENT_CLINIC_OR_DEPARTMENT_OTHER): Payer: Self-pay | Admitting: Emergency Medicine

## 2018-10-23 DIAGNOSIS — F17210 Nicotine dependence, cigarettes, uncomplicated: Secondary | ICD-10-CM | POA: Insufficient documentation

## 2018-10-23 DIAGNOSIS — K279 Peptic ulcer, site unspecified, unspecified as acute or chronic, without hemorrhage or perforation: Secondary | ICD-10-CM | POA: Insufficient documentation

## 2018-10-23 DIAGNOSIS — I252 Old myocardial infarction: Secondary | ICD-10-CM | POA: Insufficient documentation

## 2018-10-23 DIAGNOSIS — K219 Gastro-esophageal reflux disease without esophagitis: Secondary | ICD-10-CM | POA: Insufficient documentation

## 2018-10-23 LAB — COMPREHENSIVE METABOLIC PANEL
ALT: 31 U/L (ref 0–44)
AST: 21 U/L (ref 15–41)
Albumin: 4.2 g/dL (ref 3.5–5.0)
Alkaline Phosphatase: 43 U/L (ref 38–126)
Anion gap: 7 (ref 5–15)
BUN: 14 mg/dL (ref 6–20)
CO2: 26 mmol/L (ref 22–32)
Calcium: 9.4 mg/dL (ref 8.9–10.3)
Chloride: 106 mmol/L (ref 98–111)
Creatinine, Ser: 0.86 mg/dL (ref 0.61–1.24)
GFR calc Af Amer: >60 mL/min (ref >60)
GFR calc non Af Amer: >60 mL/min (ref >60)
Glucose, Bld: 99 mg/dL (ref 70–99)
Potassium: 3.8 mmol/L (ref 3.5–5.1)
Sodium: 139 mmol/L (ref 135–145)
Total Bilirubin: 0.5 mg/dL (ref 0.3–1.2)
Total Protein: 7.2 g/dL (ref 6.5–8.1)

## 2018-10-23 LAB — CBC WITH DIFFERENTIAL/PLATELET
Abs Immature Granulocytes: 0.01 10*3/uL (ref 0.00–0.07)
Basophils Absolute: 0.1 10*3/uL (ref 0.0–0.1)
Basophils Relative: 1 %
Eosinophils Absolute: 0.2 10*3/uL (ref 0.0–0.5)
Eosinophils Relative: 2 %
HCT: 38.7 % — ABNORMAL LOW (ref 39.0–52.0)
Hemoglobin: 12.2 g/dL — ABNORMAL LOW (ref 13.0–17.0)
Immature Granulocytes: 0 %
Lymphocytes Relative: 39 %
Lymphs Abs: 2.9 10*3/uL (ref 0.7–4.0)
MCH: 28.9 pg (ref 26.0–34.0)
MCHC: 31.5 g/dL (ref 30.0–36.0)
MCV: 91.7 fL (ref 80.0–100.0)
Monocytes Absolute: 0.5 10*3/uL (ref 0.1–1.0)
Monocytes Relative: 7 %
Neutro Abs: 3.9 10*3/uL (ref 1.7–7.7)
Neutrophils Relative %: 51 %
Platelets: 237 10*3/uL (ref 150–400)
RBC: 4.22 MIL/uL (ref 4.22–5.81)
RDW: 12.4 % (ref 11.5–15.5)
WBC: 7.6 10*3/uL (ref 4.0–10.5)
nRBC: 0 % (ref 0.0–0.2)

## 2018-10-23 LAB — OCCULT BLOOD X 1 CARD TO LAB, STOOL: Fecal Occult Bld: NEGATIVE

## 2018-10-23 LAB — LIPASE, BLOOD: Lipase: 101 U/L — ABNORMAL HIGH (ref 11–51)

## 2018-10-23 MED ORDER — FAMOTIDINE 20 MG PO TABS: 20.0000 mg | ORAL_TABLET | Freq: Two times a day (BID) | ORAL | 0 refills | Status: AC

## 2018-10-23 MED ORDER — LIDOCAINE VISCOUS HCL 2 % MT SOLN
15.0000 mL | Freq: Once | OROMUCOSAL | Status: AC
  Administered 2018-10-23: 10 mL via ORAL
  Filled 2018-10-23: qty 15

## 2018-10-23 MED ORDER — IOHEXOL 300 MG/ML  SOLN
100.0000 mL | Freq: Once | INTRAMUSCULAR | Status: AC | PRN
  Administered 2018-10-23: 100 mL via INTRAVENOUS

## 2018-10-23 MED ORDER — FAMOTIDINE IN NACL 20-0.9 MG/50ML-% IV SOLN
20.0000 mg | Freq: Once | INTRAVENOUS | Status: AC
  Administered 2018-10-23: 20 mg via INTRAVENOUS
  Filled 2018-10-23: qty 50

## 2018-10-23 MED ORDER — ONDANSETRON HCL 4 MG/2ML IJ SOLN
4.0000 mg | Freq: Once | INTRAMUSCULAR | Status: AC
  Administered 2018-10-23: 4 mg via INTRAVENOUS
  Filled 2018-10-23: qty 2

## 2018-10-23 MED ORDER — HYDROCORTISONE 1 % EX OINT: 1.0000 "application " | TOPICAL_OINTMENT | Freq: Two times a day (BID) | CUTANEOUS | 0 refills | Status: AC

## 2018-10-23 MED ORDER — SUCRALFATE 1 GM/10ML PO SUSP: 1.0000 g | Freq: Three times a day (TID) | ORAL | 0 refills | Status: DC

## 2018-10-23 MED ORDER — ALUM & MAG HYDROXIDE-SIMETH 200-200-20 MG/5ML PO SUSP
30.0000 mL | Freq: Once | ORAL | Status: AC
  Administered 2018-10-23: 30 mL via ORAL
  Filled 2018-10-23: qty 30

## 2018-10-23 NOTE — Progress Notes (Addendum)
Dear: [name] have been approved to have the prescriptions written by your discharging physician filled through our [facility] (Medication Assistance Through [facility]) program. This program allows for a one-time (no refills) 34-day supply of selected medications for a low copay amount.  The copay is $3.00 per prescription. For instance, if you have one prescription, you will pay $3.00; for two prescriptions, you pay $6.00; for three prescriptions, you pay $9.00; and so on.  Only certain pharmacies are participating in this program with [facility]. You will need to select one of the pharmacies from the attached list and take your prescriptions, this letter, and your photo ID to one of the participating pharmacies.   We are excited that you are able to use the [facility] program to get your medications. These prescriptions must be filled within 7 days of hospital discharge or they will no longer be valid for the [facility] program. Should you have any problems with your prescriptions please contact your case management team member at [phone] or [phone] for Steve Davenport Long/Goddard/ Lyman you, [place], [address] [place], [place], [place], [place], Celoron and Holley, [place], Oakwood Bladen General Motors, [place], Fortune Brands, [place], [address] [place], [place], [place], [place], [place], [place], [place], [place], [place] [address] [place], [place], [employer], [address] [place], [place], At[place] [address] [place], [place], [place], [place], [place], [place] [address], [place], [place] [address] [place], Riva [address] [place], [place], [place], [place], Soham [place], [place], [place] Aid [address], [place], [place] [address], [place], [place] [address] [place], North [place], [place], [place] [address] [place], Arial [address] [place], [place], [place], [place], [place], [place], [place], [place]       [place] [address] [place], [place], Tol[place] Lake [address], [place], [place] [address], [place], [place], [place], [place], [place]                                     [address] [address], [place], [place]  [address], [place], [place] [id number] [address], [place], [place]                               [id number] S[address], [place], [place] [address], [place], [place]                                         [id number] [address], [place] [id number] E. [address], [place], [place]    147407 [address], [place], [place] [address], [address], [place] Lo[place], [place]    [address], [place], [place] [address], [place], [place]     [address], [place], [place] [id number] [address], [place], [place]  [facility] [address], [place], [place]                                          [id number] Carrollwood[place]wndale [place], [place] [id number] [address], [place], [place]                                          [address], [place], [place] [address], [place], [place]                                           [id number] Sadie[place], [place], [place] [id number] [address], [place], [place]                                    [address], [place], [place] [address], [place], [place]                                           [address], Bull [place], [place] [id number] [address], [place], [place]                                          [address], [place] Mills[place], [place] [address], [place], [place]                                             2758 [address], [place], [place] [address], [place], [place]   [facility]   [facility], [address], [place], [place]     [facility] [address], [place], [place]    [id number] [address], [place], [place] [id number] [address] OraWebb, [place], [place]   [facility]  [address], [place], [place]    [address], [place], [place]   [facility]        [address], [place], [place]      [facility]  [address], [place], [place]     [address]South [address], [place], [place]                                           [id number] [address], [place], [place]

## 2018-10-23 NOTE — ED Provider Notes (Signed)
[facility] EMERGENCY DEPARTMENT Provider Note   CSN: [id number] Arrival date & time: 10/23/18  1320    History   Chief Complaint Chief Complaint  Patient presents with   Abdominal Pain    HPI Steve Davenport is a 43 y.o. male has no history of anxiety, bipolar, MI, pancreatitis, polysubstance abuse who presents for evaluation of epigastric abdominal pain that radiates into the midsternal chest area that is been ongoing for several weeks but worse in the last several days.  Patient states that he has "burning type pain" in the epigastric region and then states that he will have "an acid type burning pain in the middle of his chest up into his throat."  He states [facility] and has been taking Mylanta whenever he has his pain.  He has a history of peptic ulcer but states he is not currently on medications.  He has seen a GI doctor in [place] previously but is never seen one in [place].  Patient states that the pain in his epigastric region is worse after eating.  He states that about 30 minutes after eating, he will have a "twisting type hurting pain" that takes a while to go away.  He has some associated nausea/vomiting.  He states that occasionally last week, he noticed some bright pink in his vomit but otherwise denies any pure hematemesis, bilious emesis.  He states he noticed one episode of bright red blood on his stools about 4 days ago.  No black or tarry stools.  No coffee-ground emesis.  He does report that he has been taking ibuprofen daily, once in the morning and once at night for back pain.  Additionally, he states that he smokes about 18 cigarettes a day.  He denies any fevers, shortness of breath, or complaints.  Patient also reports a rash to his buttock times a week.  He states that he only noticed it once he started being in [facility].  He states it is pruritic and once he starts scratching it, it becomes more painful.  No  noted allergies.  No new soaps, lotions, detergents.     The history is provided by the patient.    Past Medical History:  Diagnosis Date   Anxiety    Bipolar disorder (HCC)    Depression    Kidney stone    Mental health disorder    Myocardial infarction [facility](HCC) 2006   stress/anxiety. cath done no damage. no stents.    Pancreatitis    three hospitalizations, per patient secondary to etoh   Polysubstance abuse (HCC)    Suicidal ideation 10/09/2018    Patient Active Problem List   Diagnosis Date Noted   Alcohol use disorder, severe, dependence (HCC) 10/12/2018   Opioid dependence with opioid-induced mood disorder (HCC)    Bipolar I disorder, most recent episode depressed (HCC) 10/10/2018   Esophageal dysphagia 10/08/2016   Odynophagia 10/08/2016   Chronic hepatitis C (HCC) 10/08/2016   Bipolar I disorder, current or most recent episode depressed, with psychotic features (HCC) 07/22/2016   Opioid withdrawal (HCC) 07/22/2016   Opioid use disorder, severe, dependence (HCC) 07/22/2016   Tobacco use disorder 07/22/2016   Sedative, hypnotic or anxiolytic use disorder, severe, dependence (HCC) 07/22/2016    Past Surgical History:  Procedure Laterality Date   CARDIAC CATHETERIZATION  11/05/2005   normal coronary arteries with EF 50% ([facility])   COLONOSCOPY     ESOPHAGEAL DILATION  Home Medications    Prior to Admission medications   Medication Sig Start Date End Date Taking? Authorizing Provider  DULoxetine (CYMBALTA) 20 MG capsule Take 1 capsule (20 mg total) by mouth daily. 10/14/18   Aldean BakerSykes, Janet E, NP  famotidine (PEPCID) 20 MG tablet Take 1 tablet (20 mg total) by mouth 2 (two) times daily. 10/23/18   Maxwell CaulLayden, Montine Hight A, PA-C  hydrocortisone 1 % ointment Apply 1 application topically 2 (two) times daily. 10/23/18   Maxwell CaulLayden, Sedona Wenk A, PA-C  nicotine (NICODERM CQ - DOSED IN MG/24 HOURS) 21 mg/24hr patch Place 1 patch (21 mg total) onto the skin  daily. 10/14/18   Aldean BakerSykes, Janet E, NP  QUEtiapine (SEROQUEL) 50 MG tablet Take 1 tablet (50 mg total) by mouth at bedtime. 10/13/18   Aldean BakerSykes, Janet E, NP  sucralfate (CARAFATE) 1 GM/10ML suspension Take 10 mLs (1 g total) by mouth 4 (four) times daily -  with meals and at bedtime. 10/23/18   Maxwell CaulLayden, Dung Salinger A, PA-C    Family History Family History  Problem Relation Age of Onset   Cancer Mother        breast   Cancer Father        lymphoma    Social History Social History   Tobacco Use   Smoking status: Current Every Day Smoker    Packs/day: 0.50    Types: Cigarettes   Smokeless tobacco: Never Used  Substance Use Topics   Alcohol use: No    Comment: history heavy etoh in his 20's, quit at age 43   Drug use: Not Currently    Types: Heroin, Cocaine    Comment: last use 05/2016     Allergies   Tilapia [fish allergy] and Tramadol   Review of Systems Review of Systems  Constitutional: Negative for fever.  Respiratory: Negative for cough and shortness of breath.   Cardiovascular: Negative for chest pain.  Gastrointestinal: Positive for abdominal pain, blood in stool, nausea and vomiting.  Genitourinary: Negative for dysuria and hematuria.  Neurological: Negative for headaches.  All other systems reviewed and are negative.    Physical Exam Updated Vital Signs BP 120/62    Pulse 75    Temp 98 F (36.7 C) (Oral)    Resp 16    Ht 5\' 9"  (1.753 m)    Wt 72.6 kg    SpO2 98%    BMI 23.63 kg/m   Physical Exam Vitals signs and nursing note reviewed. Exam conducted with a chaperone present.  Constitutional:      Appearance: Normal appearance. He is well-developed.  HENT:     Head: Normocephalic and atraumatic.  Eyes:     General: Lids are normal.     Conjunctiva/sclera: Conjunctivae normal.     Pupils: Pupils are equal, round, and reactive to light.  Neck:     Musculoskeletal: Full passive range of motion without pain.  Cardiovascular:     Rate and Rhythm: Normal rate  and regular rhythm.     Pulses: Normal pulses.     Heart sounds: Normal heart sounds. No murmur. No friction rub. No gallop.   Pulmonary:     Effort: Pulmonary effort is normal.     Breath sounds: Normal breath sounds.  Abdominal:     Palpations: Abdomen is soft. Abdomen is not rigid.     Tenderness: There is abdominal tenderness in the epigastric area and left upper quadrant. There is no guarding.     Comments: Abdomen is soft, nondistended.  Tenderness noted to the epigastric and left upper quadrant region.  No rigidity, guarding.  Genitourinary:    Comments: The exam was performed with a chaperone present. Digital Rectal Exam reveals sphincter with good tone. No external hemorrhoids. No masses or fissures. Stool color is Koury with no overt blood. Musculoskeletal: Normal range of motion.  Skin:    General: Skin is warm and dry.     Capillary Refill: Capillary refill takes less than 2 seconds.     Comments: Scattered erythematous, scaly rash with excoriations noted to buttock.  No surrounding warmth, drainage.  Neurological:     Mental Status: He is alert and oriented to person, place, and time.  Psychiatric:        Speech: Speech normal.      ED Treatments / Results  Labs (all labs ordered are listed, but only abnormal results are displayed) Labs Reviewed  CBC WITH DIFFERENTIAL/PLATELET - Abnormal; Notable for the following components:      Result Value   Hemoglobin 12.2 (*)    HCT 38.7 (*)    All other components within normal limits  LIPASE, BLOOD - Abnormal; Notable for the following components:   Lipase 101 (*)    All other components within normal limits  OCCULT BLOOD X 1 CARD TO LAB, STOOL  COMPREHENSIVE METABOLIC PANEL    EKG EKG Interpretation  Date/Time:  Saturday October 23 2018 14:26:42 EDT Ventricular Rate:  56 PR Interval:    QRS Duration: 98 QT Interval:  394 QTC Calculation: 381 R Axis:   83 Text Interpretation:  Sinus rhythm Baseline wander in  lead(s) I III aVL V6 since last tracing no significant change Confirmed by Rolan BuccoBelfi, Melanie [phone]([id number]) on 10/23/2018 2:53:49 PM   Radiology Ct Abdomen Pelvis W Contrast  Result Date: 10/23/2018 CLINICAL DATA:  Epigastric pain. History of gastric ulcer and pancreatitis. EXAM: CT ABDOMEN AND PELVIS WITH CONTRAST TECHNIQUE: Multidetector CT imaging of the abdomen and pelvis was performed using the standard protocol following bolus administration of intravenous contrast. CONTRAST:  100mL OMNIPAQUE IOHEXOL 300 MG/ML  SOLN COMPARISON:  Multiple exams, including 07/20/2016 FINDINGS: Lower chest: Unremarkable Hepatobiliary: Contracted gallbladder.  Otherwise unremarkable. Pancreas: Unremarkable Spleen: Unremarkable Adrenals/Urinary Tract: Unremarkable Stomach/Bowel: No obvious gastric ulcer although CT is not sensitive for small ulcers. Normal appendix. Prominent stool throughout the colon favors constipation. No dilated bowel. Vascular/Lymphatic: Unremarkable Reproductive: Unremarkable Other: No supplemental non-categorized findings. Musculoskeletal: Unremarkable IMPRESSION: 1. Prominent stool throughout the colon favors constipation. Otherwise, no significant abnormalities are observed. Electronically Signed   By: Gaylyn RongWalter  Liebkemann M.D.   On: 10/23/2018 15:48    Procedures Procedures (including critical care time)  Medications Ordered in ED Medications  ondansetron (ZOFRAN) injection 4 mg (4 mg Intravenous Given 10/23/18 1441)  alum & mag hydroxide-simeth (MAALOX/MYLANTA) 200-200-20 MG/5ML suspension 30 mL (30 mLs Oral Given 10/23/18 1441)    And  lidocaine (XYLOCAINE) 2 % viscous mouth solution 15 mL (10 mLs Oral Given 10/23/18 1441)  famotidine (PEPCID) IVPB 20 mg premix (0 mg Intravenous Stopped 10/23/18 1535)  iohexol (OMNIPAQUE) 300 MG/ML solution 100 mL (100 mLs Intravenous Contrast Given 10/23/18 1530)     Initial Impression / Assessment and Plan / ED Course  I have reviewed the triage vital signs and  the nursing notes.  Pertinent labs & imaging results that were available during my care of the patient were reviewed by me and considered in my medical decision making (see chart for details).        43 year old  male with past medical history of pancreatitis, MI, peptic ulcer disease who presents for evaluation of epigastric abdominal pain as well as some nausea/vomiting.  Also endorses some GERD.  History of seeing GI in [place] but is never seen GI in [place].  States he had a few episodes of bright pink emesis but no gross hematemesis.  No coffee-ground emesis.  One episode of bright red blood on stool this past week but no black or tarry stools. Patient is afebrile, non-toxic appearing, sitting comfortably on examination table. Vital signs reviewed and stable.  Patient with epigastric and left upper quadrant abdominal pain.  Consider pancreatitis versus infectious etiology versus PUD versus GERD.  Patient does endorse smoking and states recent NSAID use which may have contributed to worsening PUD.  Plan for GI cocktail and labs.  Additionally, patient has diffuse scaly, erythematous rash noted on buttock.  No other rash. Rash does not extend down his legs.  Exam not concerning for shingles.  No evidence of vesicles.  Question contact dermatitis versus irritant given new environment.   Areas documented above.  No evidence of gross melena.  Lipase is elevated at 101.  His most recent lipase was in May 2020 and was 40 at that time.  CBC with no significant leukocytosis.  Hemoglobin stable at 12.2.  Do not suspect GI bleed at this time.  CMP is unremarkable.  Fecal occult was negative.  Given history of pancreatitis as well as elevated lipase, will plan for CT abdomen pelvis for imaging.  CT abdomen pelvis shows prominent stool noted but otherwise no acute abnormalities.  Results with patient.  I did discuss with him at length regarding dietary changes and lifestyle changes that would help  with peptic ulcer disease/GERD.  Additionally, will plan to put him on Pepcid and Carafate for symptomatic relief.  Patient given outpatient referral to GI.  Additionally, given rash, will plan to give short course of hydrocortisone ointment. At this time, patient exhibits no emergent life-threatening condition that require further evaluation in ED or admission. Patient had ample opportunity for questions and discussion. All patient's questions were answered with full understanding. Strict return precautions discussed. Patient expresses understanding and agreement to plan.   Portions of this note were generated with Lobbyist. Dictation errors may occur despite best attempts at proofreading.    Final Clinical Impressions(s) / ED Diagnoses   Final diagnoses:  PUD (peptic ulcer disease)  Gastroesophageal reflux disease, esophagitis presence not specified    ED Discharge Orders         Ordered    sucralfate (CARAFATE) 1 GM/10ML suspension  3 times daily with meals & bedtime     10/23/18 1602    famotidine (PEPCID) 20 MG tablet  2 times daily     10/23/18 1602    hydrocortisone 1 % ointment  2 times daily     10/23/18 1602           Volanda Napoleon, PA-C 10/23/18 1725    Malvin Johns, MD 10/24/18 [phone]

## 2018-10-23 NOTE — Progress Notes (Signed)
Steve Davenport ED  TOC CM -referral PCP/2 ED visit in six months  Placed information for [facility] [place]. He will see NP, Marliss Coots who follows pt who are currently without housing/homeless. Pt can also follow up with [facility] for other services. Jonnie Finner RN CCM Case Mgmt phone [phone]

## 2018-10-23 NOTE — Discharge Instructions (Signed)
As we discussed, you will need to follow-up with referred GI doctor.    Take medications as directed.  As we discussed, limiting smoking and use of ibuprofen will help with peptic ulcer disease.  Additionally, apply cream to rash as directed.  Return emergency department for any fevers, abdominal pain, vomiting or any other worsening or concerning symptoms.

## 2018-10-23 NOTE — ED Triage Notes (Addendum)
Hx of gastric ulcer.  Sts since being at [facility] it has been bothering him more.  Epigastric pain, burning in throat, N/V.  Sts they have been giving him Mylanta but it is not working.  Constant dull ache that is worse when he eats.  Also has "rash on my butt since I've been over there at that place."

## 2018-10-25 MED FILL — FAMOTIDINE 40 MG TABLET: 40 | 30 days supply | Qty: 15 | Fill #0

## 2018-10-25 MED FILL — SUCRALFATE 1 GM/10ML SUSP: 1 | 11 days supply | Qty: 420 | Fill #0

## 2018-11-04 ENCOUNTER — Telehealth (HOSPITAL_BASED_OUTPATIENT_CLINIC_OR_DEPARTMENT_OTHER): Payer: Self-pay | Admitting: Physician Assistant

## 2018-11-04 MED ORDER — SUCRALFATE 1 GM/10ML PO SUSP: 1.0000 g | Freq: Three times a day (TID) | ORAL | 0 refills | Status: AC

## 2018-11-04 NOTE — Telephone Encounter (Signed)
Patient not evaluated by me while in the ED.  He was seen here on June 13, a prescription for Carafate along with MN for his PUD, he reports he has not been able to follow-up with GI, will have an appointment soon.  I was asked by charge nurse Sam to provide a new prescription for Carafate.  I have provided a prescription for Carafate, this has been sent to his [place] on: [address].  And is to be contacted by nursing staff in order to have his prescription picked up at the pharmacy.  Portions of this note were generated with Lobbyist. Dictation errors may occur despite best attempts at proofreading.

## 2018-11-09 ENCOUNTER — Other Ambulatory Visit: Payer: Self-pay

## 2018-11-09 ENCOUNTER — Encounter (HOSPITAL_COMMUNITY): Payer: Self-pay

## 2018-11-09 ENCOUNTER — Emergency Department (HOSPITAL_COMMUNITY): Payer: Self-pay

## 2018-11-09 ENCOUNTER — Emergency Department (HOSPITAL_COMMUNITY)
Admission: EM | Admit: 2018-11-09 | Discharge: 2018-11-09 | Disposition: A | Discharge status: Home / Self Care | Payer: Self-pay | Attending: Emergency Medicine | Admitting: Emergency Medicine

## 2018-11-09 DIAGNOSIS — Z79899 Other long term (current) drug therapy: Secondary | ICD-10-CM | POA: Insufficient documentation

## 2018-11-09 DIAGNOSIS — R0789 Other chest pain: Secondary | ICD-10-CM | POA: Insufficient documentation

## 2018-11-09 DIAGNOSIS — F419 Anxiety disorder, unspecified: Secondary | ICD-10-CM | POA: Insufficient documentation

## 2018-11-09 DIAGNOSIS — I252 Old myocardial infarction: Secondary | ICD-10-CM | POA: Insufficient documentation

## 2018-11-09 DIAGNOSIS — F17210 Nicotine dependence, cigarettes, uncomplicated: Secondary | ICD-10-CM | POA: Insufficient documentation

## 2018-11-09 LAB — BASIC METABOLIC PANEL
Anion gap: 11 (ref 5–15)
BUN: 8 mg/dL (ref 6–20)
CO2: 24 mmol/L (ref 22–32)
Calcium: 9.7 mg/dL (ref 8.9–10.3)
Chloride: 98 mmol/L (ref 98–111)
Creatinine, Ser: 0.95 mg/dL (ref 0.61–1.24)
GFR calc Af Amer: >60 mL/min (ref >60)
GFR calc non Af Amer: >60 mL/min (ref >60)
Glucose, Bld: 138 mg/dL — ABNORMAL HIGH (ref 70–99)
Potassium: 3.3 mmol/L — ABNORMAL LOW (ref 3.5–5.1)
Sodium: 133 mmol/L — ABNORMAL LOW (ref 135–145)

## 2018-11-09 LAB — CBC
HCT: 39.7 % (ref 39.0–52.0)
Hemoglobin: 13.6 g/dL (ref 13.0–17.0)
MCH: 30.1 pg (ref 26.0–34.0)
MCHC: 34.3 g/dL (ref 30.0–36.0)
MCV: 87.8 fL (ref 80.0–100.0)
Platelets: 221 10*3/uL (ref 150–400)
RBC: 4.52 MIL/uL (ref 4.22–5.81)
RDW: 11.8 % (ref 11.5–15.5)
WBC: 8.7 10*3/uL (ref 4.0–10.5)
nRBC: 0 % (ref 0.0–0.2)

## 2018-11-09 LAB — TROPONIN I (HIGH SENSITIVITY)
Troponin I (High Sensitivity): 3 ng/L (ref <18)
Troponin I (High Sensitivity): 3 ng/L (ref <18)

## 2018-11-09 MED ORDER — SODIUM CHLORIDE 0.9% FLUSH: 3.0000 mL | Freq: Once | INTRAVENOUS | Status: DC

## 2018-11-09 MED ORDER — LORAZEPAM 2 MG/ML IJ SOLN
1.0000 mg | Freq: Once | INTRAMUSCULAR | Status: AC
  Administered 2018-11-09: 1 mg via INTRAVENOUS
  Filled 2018-11-09: qty 1

## 2018-11-09 NOTE — ED Provider Notes (Addendum)
[facility]-EMERGENCY DEPT Provider Note   CSN: [id number] Arrival date & time: 11/09/18  1621    History   Chief Complaint Chief Complaint  Patient presents with  . Dizziness  . Chest Pain  . Shortness of Breath    HPI Steve Davenport is a 43 y.o. male.     HPI Patient presents with concern of left-sided chest pain.  Pain is pressure-like superior nonradiating.  Patient is increasingly anxious about the pressure. He notes multiple medical issues, depression, bipolar disorder, concerned about ongoing damage to his heart. Patient acknowledges smoking cigarettes, denies other illegal substances.  (This is in contradiction to the patient's chart.)   Past Medical History:  Diagnosis Date  . Anxiety   . Bipolar disorder (HCC)   . Depression   . Kidney stone   . Mental health disorder   . Myocardial infarction [facility](HCC) 2006   stress/anxiety. cath done no damage. no stents.   . Pancreatitis    three hospitalizations, per patient secondary to etoh  . Polysubstance abuse (HCC)   . Suicidal ideation 10/09/2018    Patient Active Problem List   Diagnosis Date Noted  . Alcohol use disorder, severe, dependence (HCC) 10/12/2018  . Opioid dependence with opioid-induced mood disorder (HCC)   . Bipolar I disorder, most recent episode depressed (HCC) 10/10/2018  . Esophageal dysphagia 10/08/2016  . Odynophagia 10/08/2016  . Chronic hepatitis C (HCC) 10/08/2016  . Bipolar I disorder, current or most recent episode depressed, with psychotic features (HCC) 07/22/2016  . Opioid withdrawal (HCC) 07/22/2016  . Opioid use disorder, severe, dependence (HCC) 07/22/2016  . Tobacco use disorder 07/22/2016  . Sedative, hypnotic or anxiolytic use disorder, severe, dependence (HCC) 07/22/2016    Past Surgical History:  Procedure Laterality Date  . CARDIAC CATHETERIZATION  11/05/2005   normal coronary arteries with EF 50% ([facility])  . COLONOSCOPY    . ESOPHAGEAL DILATION           Home Medications    Prior to Admission medications   Medication Sig Start Date End Date Taking? Authorizing Provider  DULoxetine (CYMBALTA) 20 MG capsule Take 1 capsule (20 mg total) by mouth daily. 10/14/18   Aldean BakerSykes, Janet E, NP  famotidine (PEPCID) 20 MG tablet Take 1 tablet (20 mg total) by mouth 2 (two) times daily. 10/23/18   Maxwell CaulLayden, Lindsey A, PA-C  hydrocortisone 1 % ointment Apply 1 application topically 2 (two) times daily. 10/23/18   Maxwell CaulLayden, Lindsey A, PA-C  nicotine (NICODERM CQ - DOSED IN MG/24 HOURS) 21 mg/24hr patch Place 1 patch (21 mg total) onto the skin daily. 10/14/18   Aldean BakerSykes, Janet E, NP  QUEtiapine (SEROQUEL) 50 MG tablet Take 1 tablet (50 mg total) by mouth at bedtime. 10/13/18   Aldean BakerSykes, Janet E, NP  sucralfate (CARAFATE) 1 GM/10ML suspension Take 10 mLs (1 g total) by mouth 4 (four) times daily -  with meals and at bedtime. 11/04/18   Claude MangesSoto, Johana, PA-C    Family History Family History  Problem Relation Age of Onset  . Cancer Mother        breast  . Cancer Father        lymphoma    Social History Social History   Tobacco Use  . Smoking status: Current Every Day Smoker    Packs/day: 0.50    Types: Cigarettes  . Smokeless tobacco: Never Used  Substance Use Topics  . Alcohol use: No    Comment: history heavy etoh in his 20's, quit  at age 43  . Drug use: Not Currently    Types: Heroin, Cocaine    Comment: last use 05/2016     Allergies   Tilapia [fish allergy] and Tramadol   Review of Systems Review of Systems  Constitutional:       Per HPI, otherwise negative  HENT:       Per HPI, otherwise negative  Respiratory:       Per HPI, otherwise negative  Cardiovascular:       Per HPI, otherwise negative  Gastrointestinal: Negative for vomiting.  Endocrine:       Negative aside from HPI  Genitourinary:       Neg aside from HPI   Musculoskeletal:       Per HPI, otherwise negative  Skin: Negative.   Neurological: Negative for syncope.   Psychiatric/Behavioral: The patient is nervous/anxious.      Physical Exam Updated Vital Signs BP (!) 158/79   Pulse 68   Temp 98.1 F (36.7 C) (Oral)   Resp (!) 24   Ht 5\' 9"  (1.753 m)   Wt 73.5 kg   SpO2 100%   BMI 23.92 kg/m   Physical Exam Vitals signs and nursing note reviewed.  Constitutional:      General: He is not in acute distress.    Appearance: He is well-developed.  HENT:     Head: Normocephalic and atraumatic.  Eyes:     Conjunctiva/sclera: Conjunctivae normal.  Cardiovascular:     Rate and Rhythm: Normal rate and regular rhythm.  Pulmonary:     Effort: Pulmonary effort is normal. No respiratory distress.     Breath sounds: No stridor.  Abdominal:     General: There is no distension.  Skin:    General: Skin is warm and dry.  Neurological:     Mental Status: He is alert and oriented to person, place, and time.  Psychiatric:        Mood and Affect: Mood is anxious.      ED Treatments / Results  Labs (all labs ordered are listed, but only abnormal results are displayed) Labs Reviewed  BASIC METABOLIC PANEL - Abnormal; Notable for the following components:      Result Value   Sodium 133 (*)    Potassium 3.3 (*)    Glucose, Bld 138 (*)    All other components within normal limits  CBC  TROPONIN I (HIGH SENSITIVITY)  TROPONIN I (HIGH SENSITIVITY)    EKG EKG Interpretation  Date/Time:  Tuesday November 09 2018 16:22:16 EDT Ventricular Rate:  81 PR Interval:    QRS Duration: 99 QT Interval:  369 QTC Calculation: 429 R Axis:   70 Text Interpretation:  Sinus rhythm RSR' in V1 or V2, probably normal variant Probable left ventricular hypertrophy Abnormal ECG Confirmed by Gerhard MunchLockwood, Dylon Correa [phone]([id number]) on 11/09/2018 5:09:41 PM   Radiology Dg Chest 2 View  Result Date: 11/09/2018 CLINICAL DATA:  43 year old male with shortness of breath and chest pain. EXAM: CHEST - 2 VIEW COMPARISON:  CT Abdomen and Pelvis 10/23/2018. FINDINGS: Normal cardiac size and  mediastinal contours. Visualized tracheal air column is within normal limits. Normal lung volumes. Anterior right 3rd and 4th rib fusion. Both lungs appear clear. No pneumothorax or pleural effusion. Negative visible bowel gas pattern. No acute osseous abnormality identified. IMPRESSION: No acute cardiopulmonary abnormality. Electronically Signed   By: Odessa FlemingH  Hall M.D.   On: 11/09/2018 19:11    Procedures Procedures (including critical care time)  Medications Ordered in  ED Medications  sodium chloride flush (NS) 0.9 % injection 3 mL (has no administration in time range)  LORazepam (ATIVAN) injection 1 mg (1 mg Intravenous Given 11/09/18 1824)     Initial Impression / Assessment and Plan / ED Course  I have reviewed the triage vital signs and the nursing notes.  Pertinent labs & imaging results that were available during my care of the patient were reviewed by me and considered in my medical decision making (see chart for details).        7:55 PM Serial troponins unremarkable, with no delta Labs unremarkable Patient anxious, but in no distress, no hemodynamic instability. Findings reassuring, no evidence for PE, ACS, pneumonia.  Some suspicion for gastroesophageal etiology versus anxiety.   However, prior to my review evaluate the patient and discussing his reassuring findings, and repeat evaluation, the patient eloped.  Final Clinical Impressions(s) / ED Diagnoses  Atypical chest pain   Carmin Muskrat, MD 11/09/18 Karl Bales    Carmin Muskrat, MD 11/09/18 2024

## 2018-11-09 NOTE — ED Notes (Signed)
ED Provider at bedside. 

## 2018-11-09 NOTE — ED Notes (Signed)
Patient left prior to receiving discharge paperwork. NT in triage reports she removed IV prior to patient exiting.

## 2018-11-09 NOTE — ED Notes (Signed)
Bed: [facility]PT1 Expected date:  Expected time:  Means of arrival:  Comments: 

## 2018-11-09 NOTE — Discharge Instructions (Addendum)
As discussed, your evaluation today has been largely reassuring.  But, it is important that you monitor your condition carefully, and do not hesitate to return to the ED if you develop new, or concerning changes in your condition. ? ?Otherwise, please follow-up with your physician for appropriate ongoing care. ? ?

## 2018-11-09 NOTE — ED Triage Notes (Signed)
Patient reports that he began having chest pressure, dizziness, SOB,tingling of hands and feet,and anxiety. Patient reports a history of anxiety attack several years ago.

## 2018-11-09 NOTE — ED Notes (Signed)
Bed: WA01 Expected date:  Expected time:  Means of arrival:  Comments: Hold triage 1

## 2018-11-09 NOTE — ED Notes (Addendum)
Pt called out stating he's "got to go". Advised Dr.Lockwood and RN Frederico Hamman of same.

## 2019-09-02 IMAGING — CT CT ABDOMEN AND PELVIS WITH CONTRAST
2 of 5 series · 17 of 46 positions shown, 19 images · IV contrast (omnipaque)
Comparison: Multiple exams, including 07/20/2016

CLINICAL DATA: Epigastric pain. History of gastric ulcer and
pancreatitis.

EXAM:
CT ABDOMEN AND PELVIS WITH CONTRAST
TECHNIQUE: Multidetector CT imaging of the abdomen and pelvis was performed
using the standard protocol following bolus administration of
intravenous contrast.
CONTRAST:  100mL OMNIPAQUE IOHEXOL 300 MG/ML  SOLN

[Series 2: axial st · axial · 0.84mm/px · z∈[-462,-36]mm · 14 of 95 slices shown, 16 images]
[im 5/95  soft-tissue]
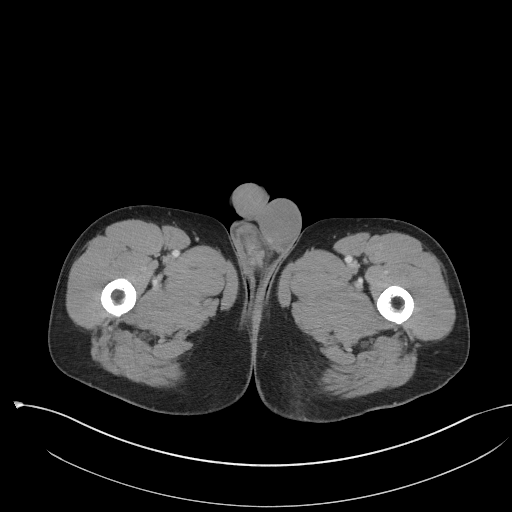
[im 5/95  bone]
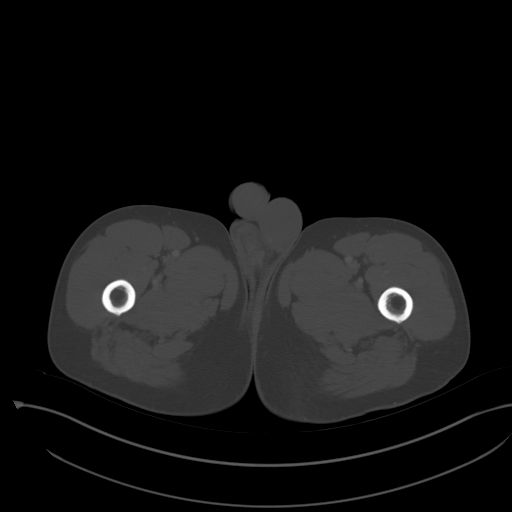
[im 10/95  soft-tissue]
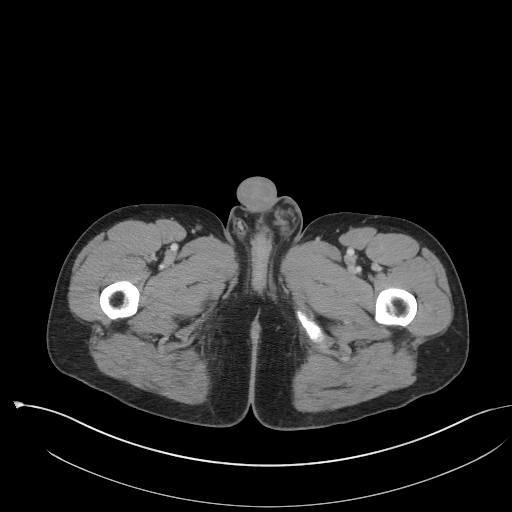
[im 20/95  soft-tissue]
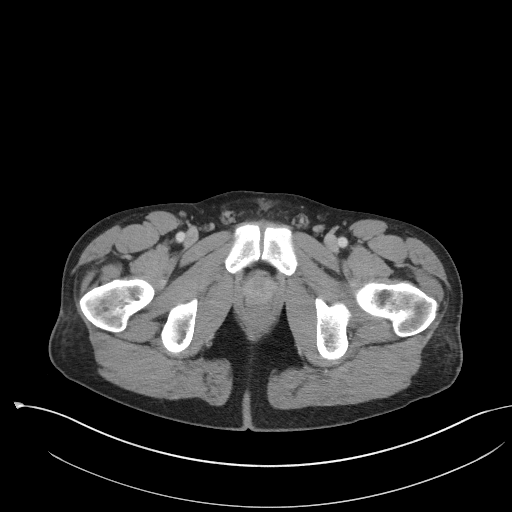
[im 25/95  soft-tissue]
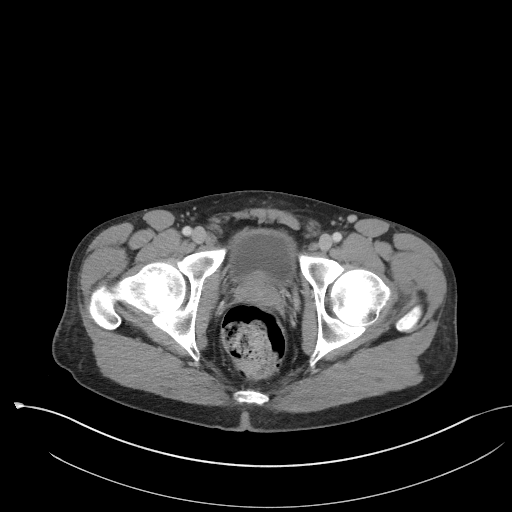
[im 30/95  soft-tissue]
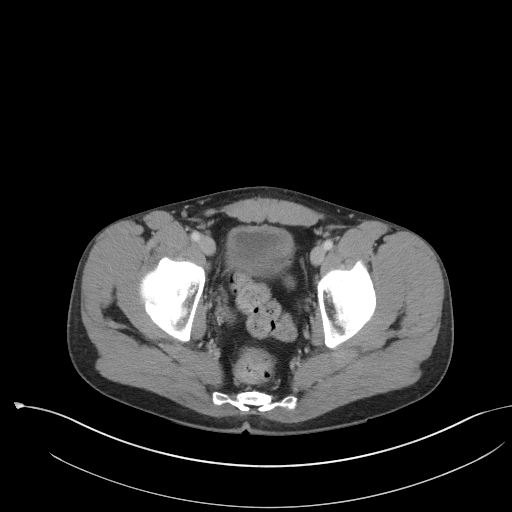
[im 40/95  soft-tissue]
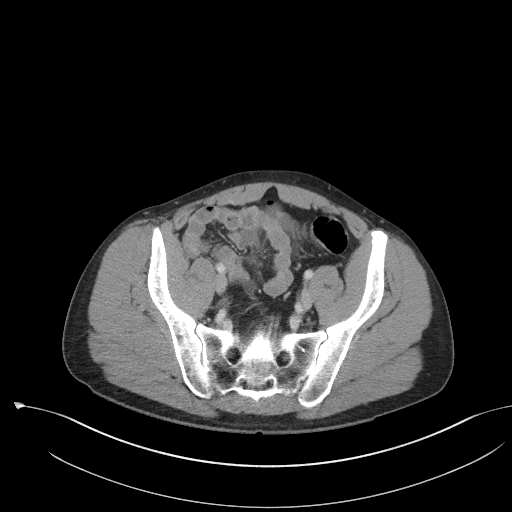
[im 45/95  soft-tissue]
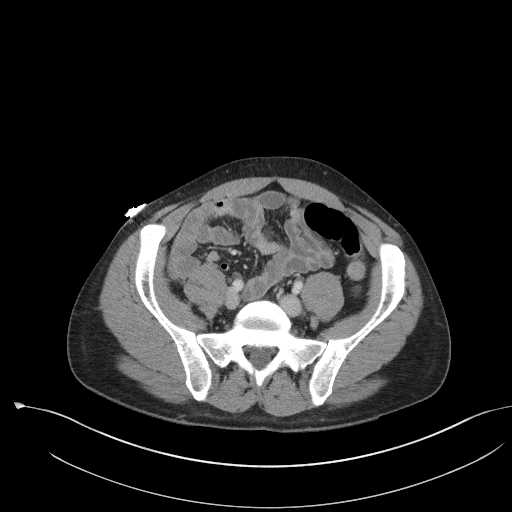
[im 50/95  soft-tissue]
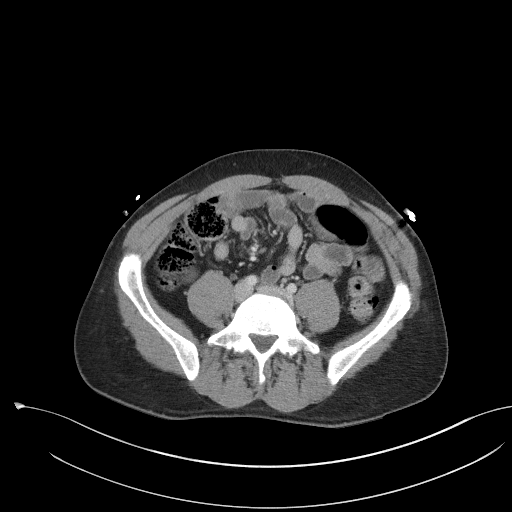
[im 55/95  soft-tissue]
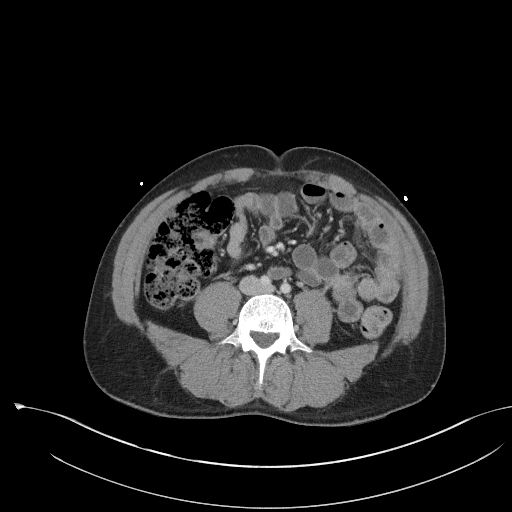
[im 55/95  bone]
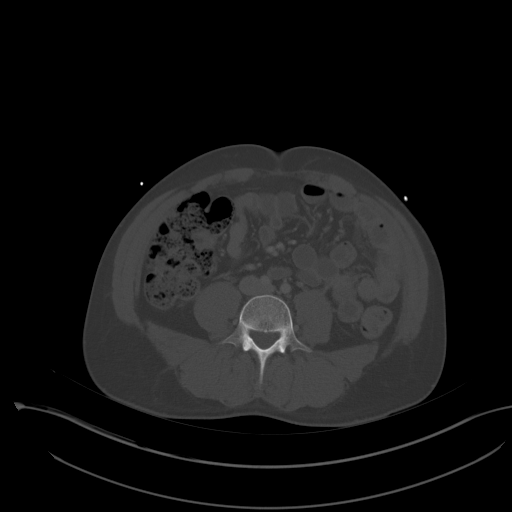
[im 65/95  soft-tissue]
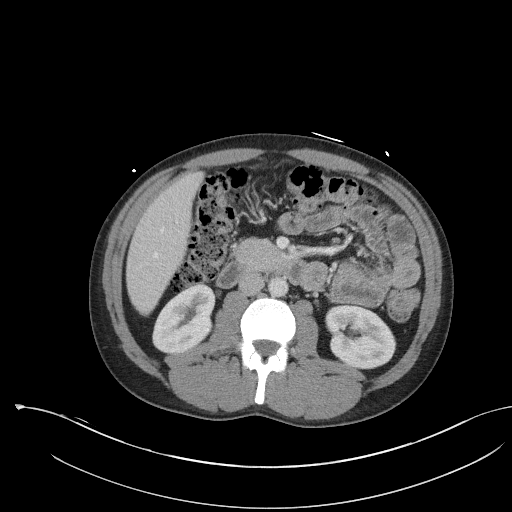
[im 70/95  soft-tissue]
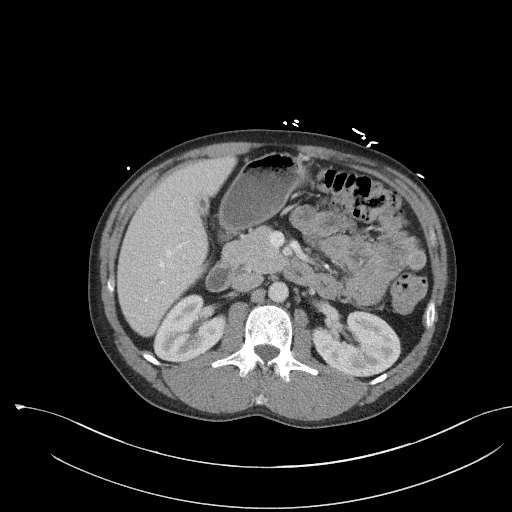
[im 75/95  soft-tissue]
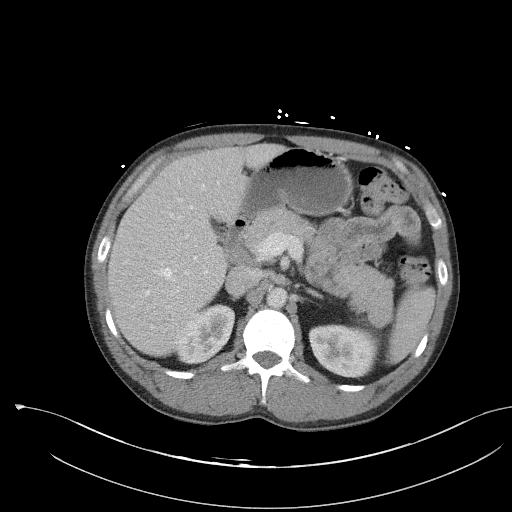
[im 85/95  soft-tissue]
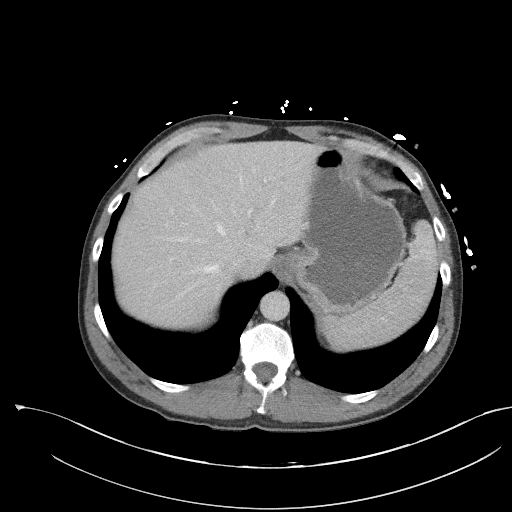
[im 90/95  soft-tissue]
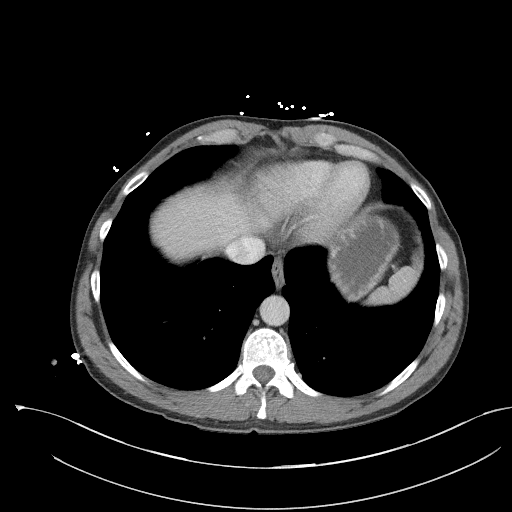

[Series 5: coronal st · coronal · 0.85mm/px · 3 of 97 slices shown]
[im 33/97  soft-tissue]
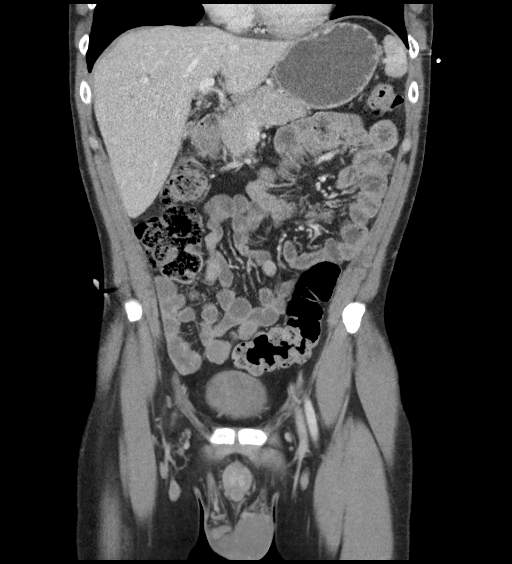
[im 43/97  soft-tissue]
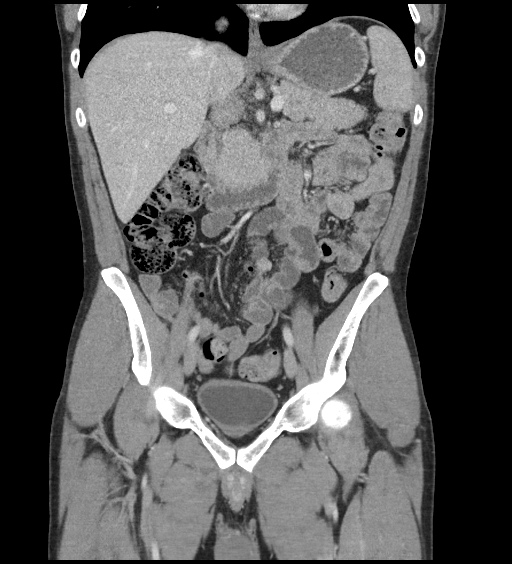
[im 54/97  soft-tissue]
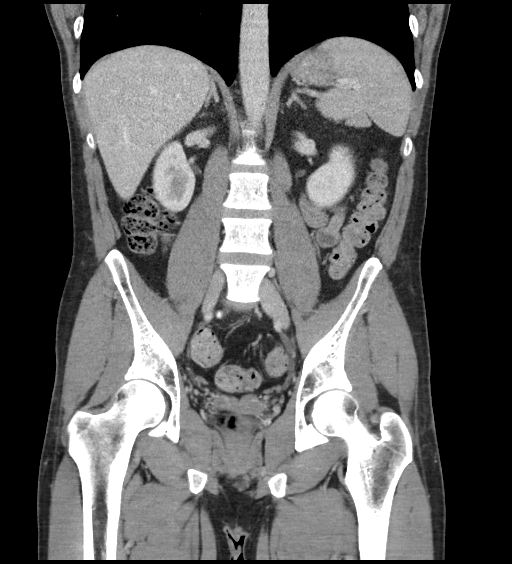

[17 of 46 positions shown; findings below may reference images not displayed]

FINDINGS: Lower chest: Unremarkable

Hepatobiliary: Contracted gallbladder.  Otherwise unremarkable.

Pancreas: Unremarkable

Spleen: Unremarkable

Adrenals/Urinary Tract: Unremarkable

Stomach/Bowel: No obvious gastric ulcer although CT is not sensitive
for small ulcers. Normal appendix. Prominent stool throughout the
colon favors constipation. No dilated bowel.

Vascular/Lymphatic: Unremarkable

Reproductive: Unremarkable

Other: No supplemental non-categorized findings.

Musculoskeletal: Unremarkable
IMPRESSION: 1. Prominent stool throughout the colon favors constipation.
Otherwise, no significant abnormalities are observed.

## 2019-09-19 IMAGING — CR CHEST - 2 VIEW
2 series · 2 of 2 positions shown · non-contrast
Comparison: CT Abdomen and Pelvis 10/23/2018.

CLINICAL DATA: 43-year-old male with shortness of breath and chest
pain.

EXAM:
CHEST - 2 VIEW

[w chest pa]
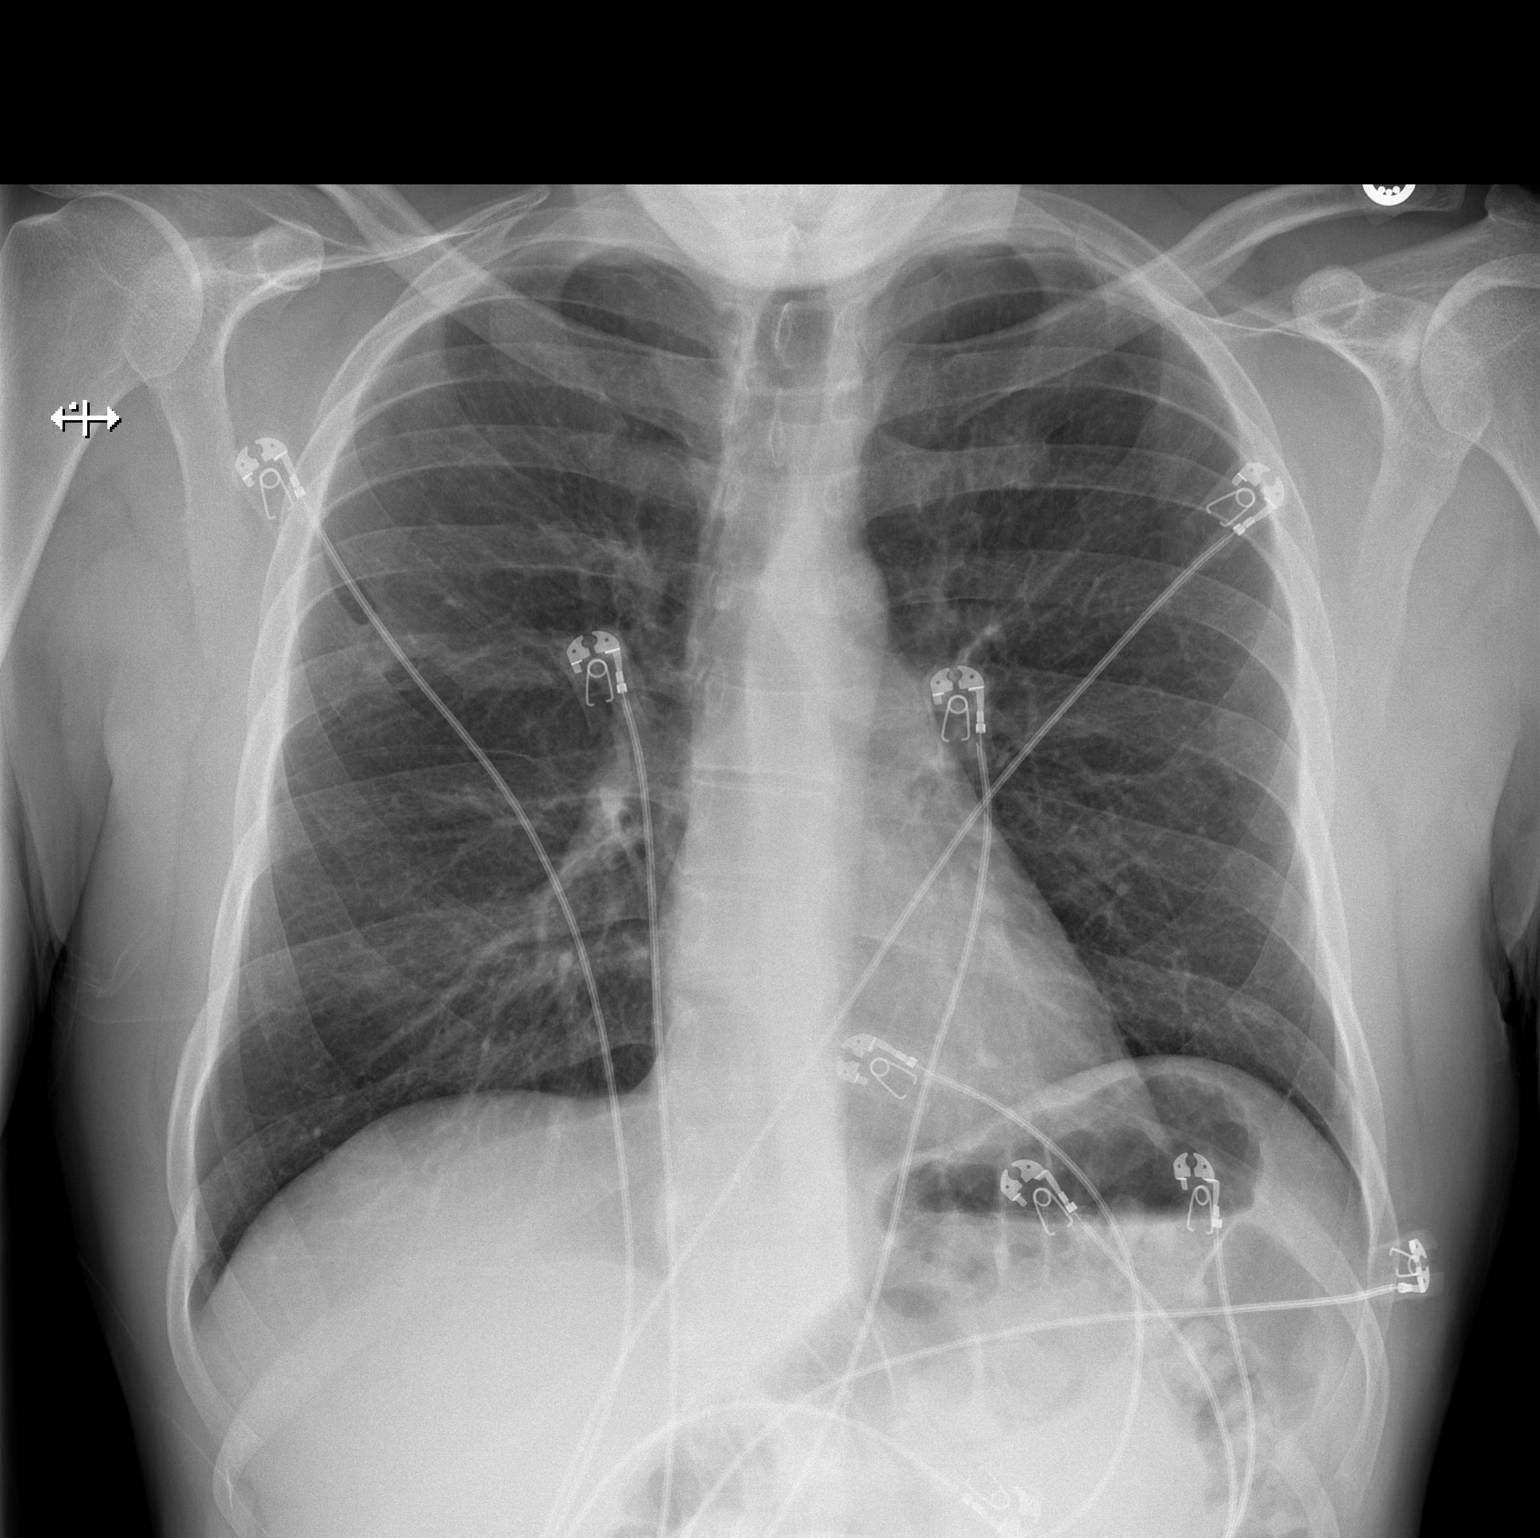

[w chest lat]
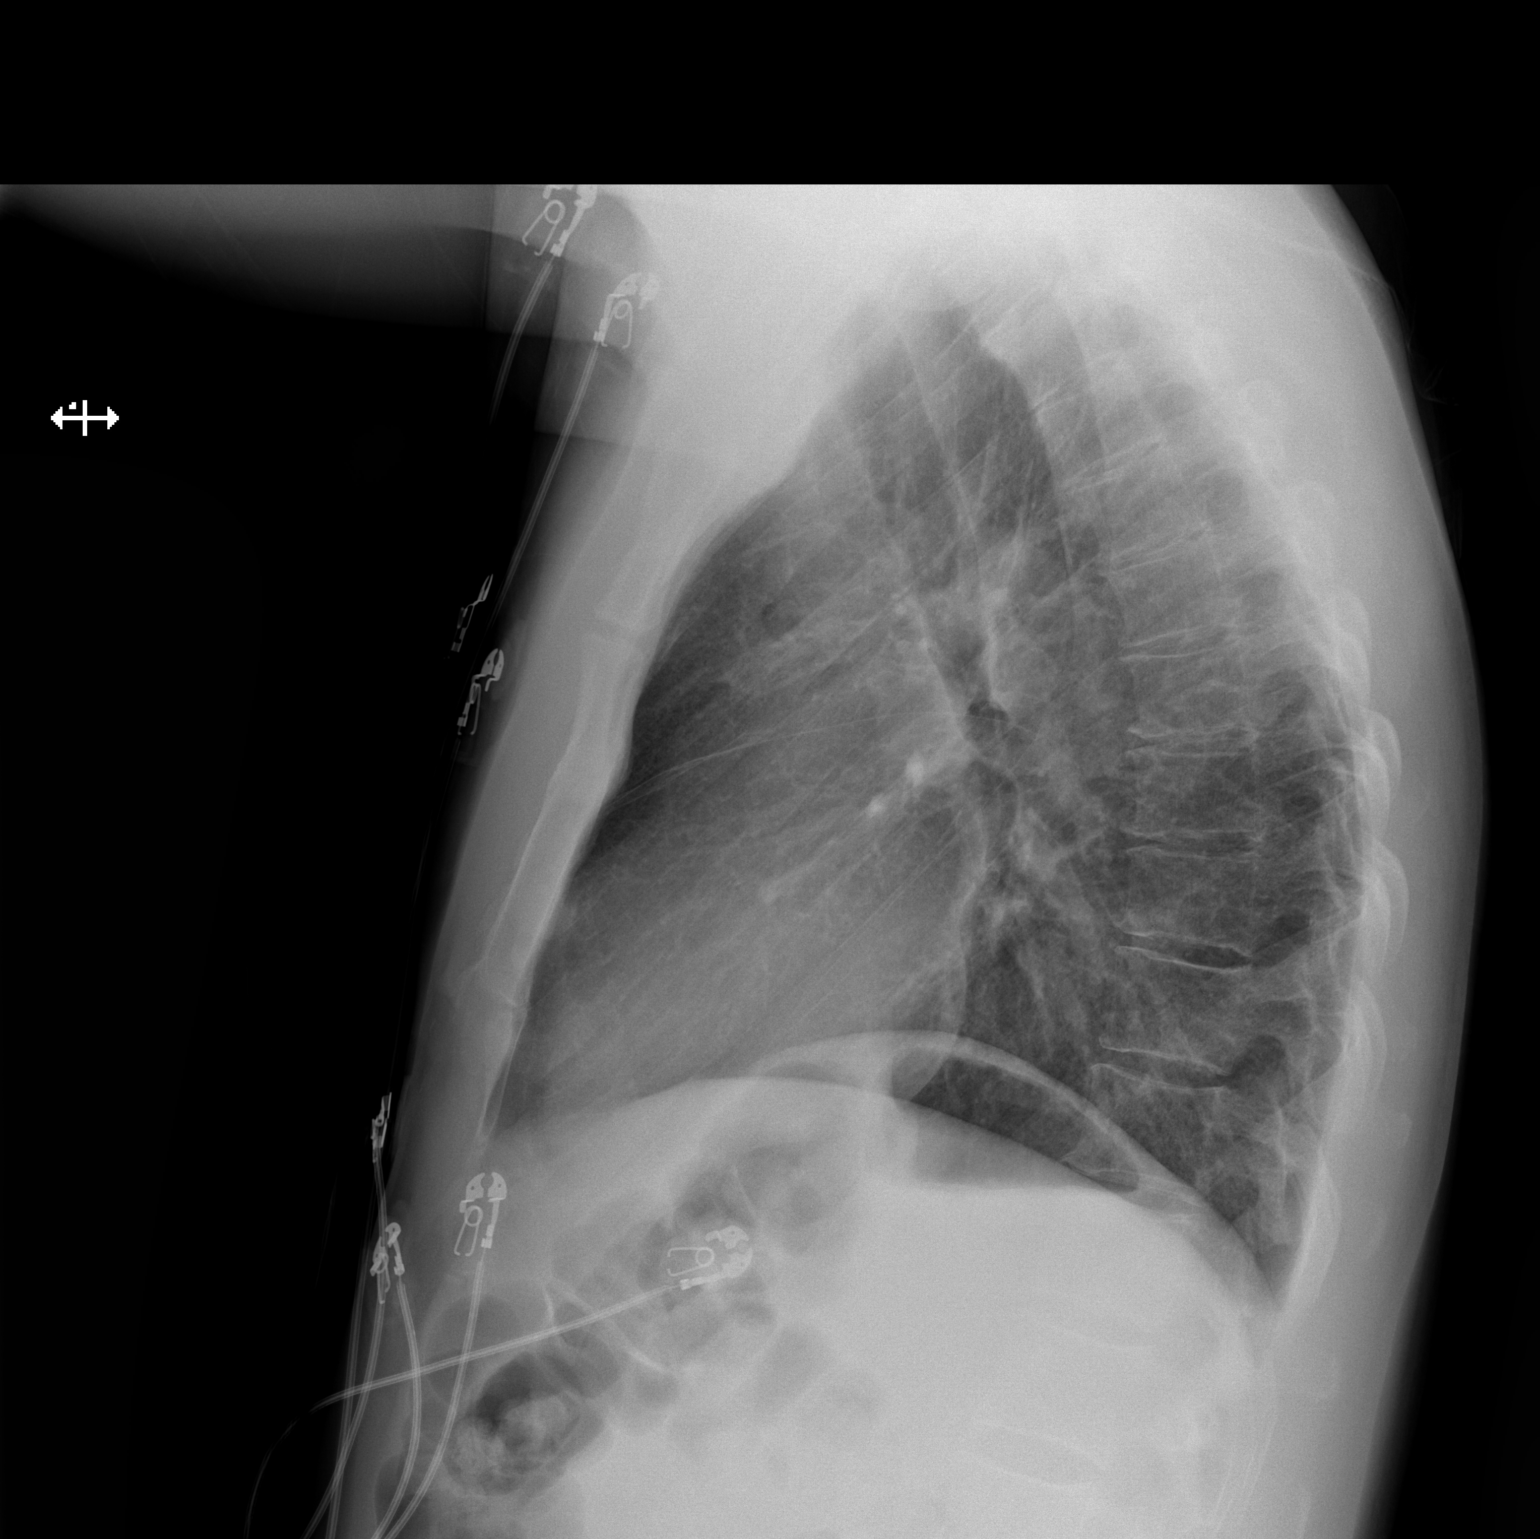

[2 of 2 positions shown; findings below may reference images not displayed]

FINDINGS: Normal cardiac size and mediastinal contours. Visualized tracheal
air column is within normal limits. Normal lung volumes. Anterior
right 3rd and 4th rib fusion. Both lungs appear clear. No
pneumothorax or pleural effusion. Negative visible bowel gas
pattern. No acute osseous abnormality identified.
IMPRESSION: No acute cardiopulmonary abnormality.
